# Patient Record
Sex: Female | Born: 1935 | Race: White | Hispanic: No | Smoking: Never smoker
Health system: Southern US, Community
[De-identification: ages and names within clinical notes are randomized; demographics above are authoritative.]

## PROBLEM LIST (undated history)

## (undated) DIAGNOSIS — C3430 Malignant neoplasm of lower lobe, unspecified bronchus or lung: Secondary | ICD-10-CM

## (undated) DIAGNOSIS — I10 Essential (primary) hypertension: Secondary | ICD-10-CM

## (undated) DIAGNOSIS — E039 Hypothyroidism, unspecified: Secondary | ICD-10-CM

## (undated) DIAGNOSIS — C801 Malignant (primary) neoplasm, unspecified: Secondary | ICD-10-CM

## (undated) HISTORY — DX: Malignant neoplasm of lower lobe, unspecified bronchus or lung: C34.30

## (undated) HISTORY — DX: Hypothyroidism, unspecified: E03.9

---

## 2003-08-12 ENCOUNTER — Ambulatory Visit (HOSPITAL_COMMUNITY): Admission: RE | Admit: 2003-08-12 | Discharge: 2003-08-12 | Payer: Self-pay | Admitting: Orthopedic Surgery

## 2003-08-16 ENCOUNTER — Inpatient Hospital Stay (HOSPITAL_COMMUNITY): Admission: RE | Admit: 2003-08-16 | Discharge: 2003-08-19 | Payer: Self-pay | Admitting: Orthopedic Surgery

## 2003-08-16 ENCOUNTER — Encounter (INDEPENDENT_AMBULATORY_CARE_PROVIDER_SITE_OTHER): Payer: Self-pay | Admitting: Specialist

## 2003-09-24 ENCOUNTER — Encounter (INDEPENDENT_AMBULATORY_CARE_PROVIDER_SITE_OTHER): Payer: Self-pay | Admitting: Specialist

## 2003-09-24 ENCOUNTER — Ambulatory Visit (HOSPITAL_COMMUNITY): Admission: RE | Admit: 2003-09-24 | Discharge: 2003-09-24 | Payer: Self-pay | Admitting: Internal Medicine

## 2003-10-18 ENCOUNTER — Ambulatory Visit (HOSPITAL_COMMUNITY): Admission: RE | Admit: 2003-10-18 | Discharge: 2003-10-18 | Payer: Self-pay | Admitting: Thoracic Surgery

## 2003-10-29 ENCOUNTER — Inpatient Hospital Stay (HOSPITAL_COMMUNITY): Admission: RE | Admit: 2003-10-29 | Discharge: 2003-11-03 | Payer: Self-pay | Admitting: Thoracic Surgery

## 2003-10-29 ENCOUNTER — Encounter (INDEPENDENT_AMBULATORY_CARE_PROVIDER_SITE_OTHER): Payer: Self-pay | Admitting: *Deleted

## 2003-11-10 ENCOUNTER — Encounter: Admission: RE | Admit: 2003-11-10 | Discharge: 2003-11-10 | Payer: Self-pay | Admitting: Thoracic Surgery

## 2003-12-08 ENCOUNTER — Encounter: Admission: RE | Admit: 2003-12-08 | Discharge: 2003-12-08 | Payer: Self-pay | Admitting: Thoracic Surgery

## 2003-12-10 ENCOUNTER — Ambulatory Visit: Admission: RE | Admit: 2003-12-10 | Discharge: 2004-01-17 | Payer: Self-pay | Admitting: Radiation Oncology

## 2004-02-02 ENCOUNTER — Encounter: Admission: RE | Admit: 2004-02-02 | Discharge: 2004-02-02 | Payer: Self-pay | Admitting: Thoracic Surgery

## 2004-02-18 ENCOUNTER — Ambulatory Visit: Admission: RE | Admit: 2004-02-18 | Discharge: 2004-04-12 | Payer: Self-pay | Admitting: Radiation Oncology

## 2004-02-22 ENCOUNTER — Ambulatory Visit: Admission: RE | Admit: 2004-02-22 | Discharge: 2004-02-22 | Payer: Self-pay | Admitting: Radiation Oncology

## 2004-04-12 ENCOUNTER — Encounter: Admission: RE | Admit: 2004-04-12 | Discharge: 2004-04-12 | Payer: Self-pay | Admitting: Thoracic Surgery

## 2004-05-11 ENCOUNTER — Ambulatory Visit: Admission: RE | Admit: 2004-05-11 | Discharge: 2004-05-11 | Payer: Self-pay | Admitting: Radiation Oncology

## 2004-07-03 ENCOUNTER — Ambulatory Visit (HOSPITAL_COMMUNITY): Admission: RE | Admit: 2004-07-03 | Discharge: 2004-07-03 | Payer: Self-pay | Admitting: Oncology

## 2004-07-10 ENCOUNTER — Ambulatory Visit: Payer: Self-pay | Admitting: Oncology

## 2004-07-11 ENCOUNTER — Encounter: Admission: RE | Admit: 2004-07-11 | Discharge: 2004-07-11 | Payer: Self-pay | Admitting: Thoracic Surgery

## 2004-10-18 ENCOUNTER — Encounter: Admission: RE | Admit: 2004-10-18 | Discharge: 2004-10-18 | Payer: Self-pay | Admitting: Thoracic Surgery

## 2004-12-30 IMAGING — NM NM BONE WHOLE BODY
3 series · 3 of 3 positions shown · non-contrast
Comparison: none

CLINICAL DATA: 78 year old with lung lesion.  Question metastatic disease. 
WHOLE BODY BONE SCAN 
25 mCi of Technetium 99m MDP was given intravenously.  Anterior and posterior images are performed of the whole body.  Bilateral renal activity and bladder activity are seen.  There is increased osseous remodeling in the region of the right acetabulum.  When compared with the prior head CT, the patient does have a right hip total arthroplasty.  There is photopenic defect secondary to the femoral prosthesis and mild increased osseous remodeling at the level of the greater trochanter and lesser trochanter.  Correlation is recommended with timing of surgery.  If surgery was recent, the activity in this region would be expected.  If this surgery was several months ago, the question of loosening or infection is raised.  
There are no focal areas of increased osseous remodeling to suggest osseous metastatic disease.  Degenerative changes are noted in the patella bilaterally.  
IMPRESSION 
No evidence for osseous metastatic disease. 
Changes about the right hip arthroplasty as described above.

[Series 1: tb total body · 0.62mm/px · 1 of 1 slices shown (1 of 3)]
[im 1/1]
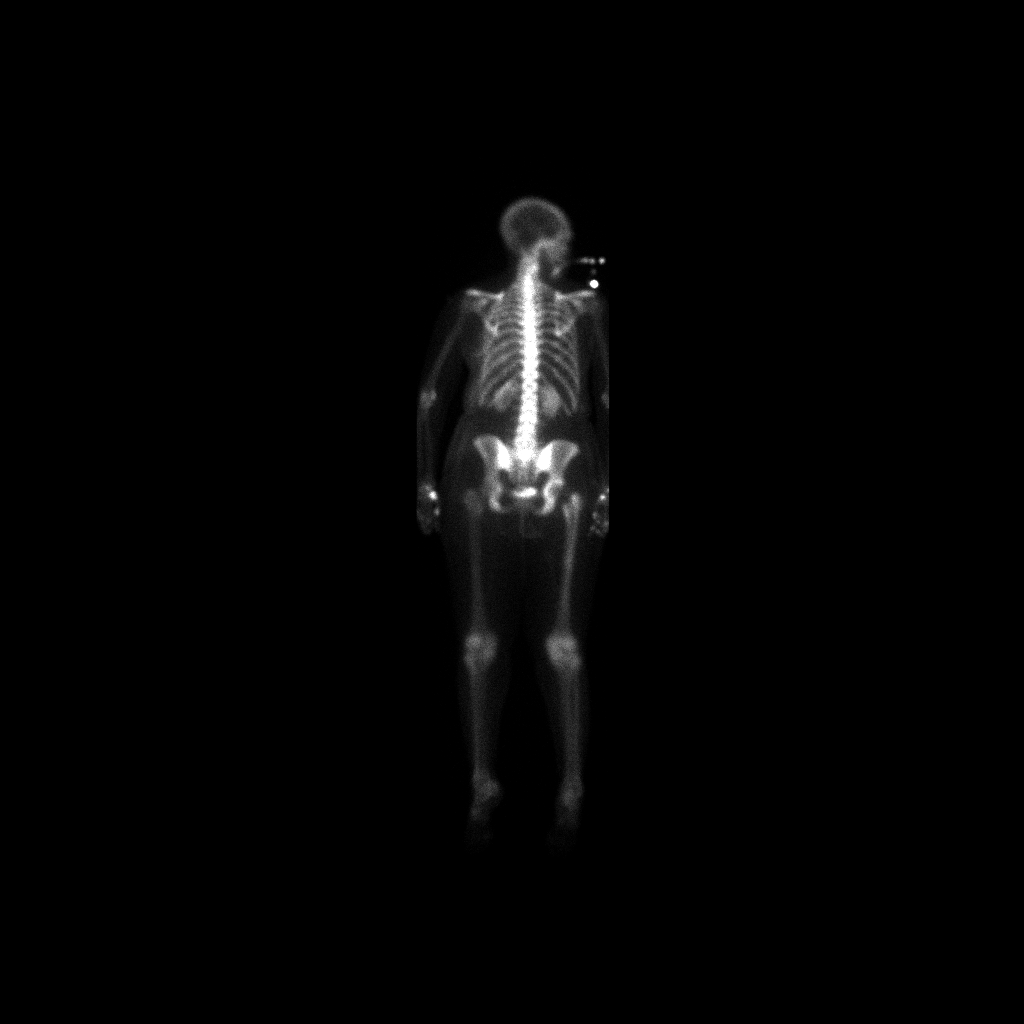

[Series 1: tb total body · 0.62mm/px · 1 of 1 slices shown (2 of 3)]
[im 1/1]
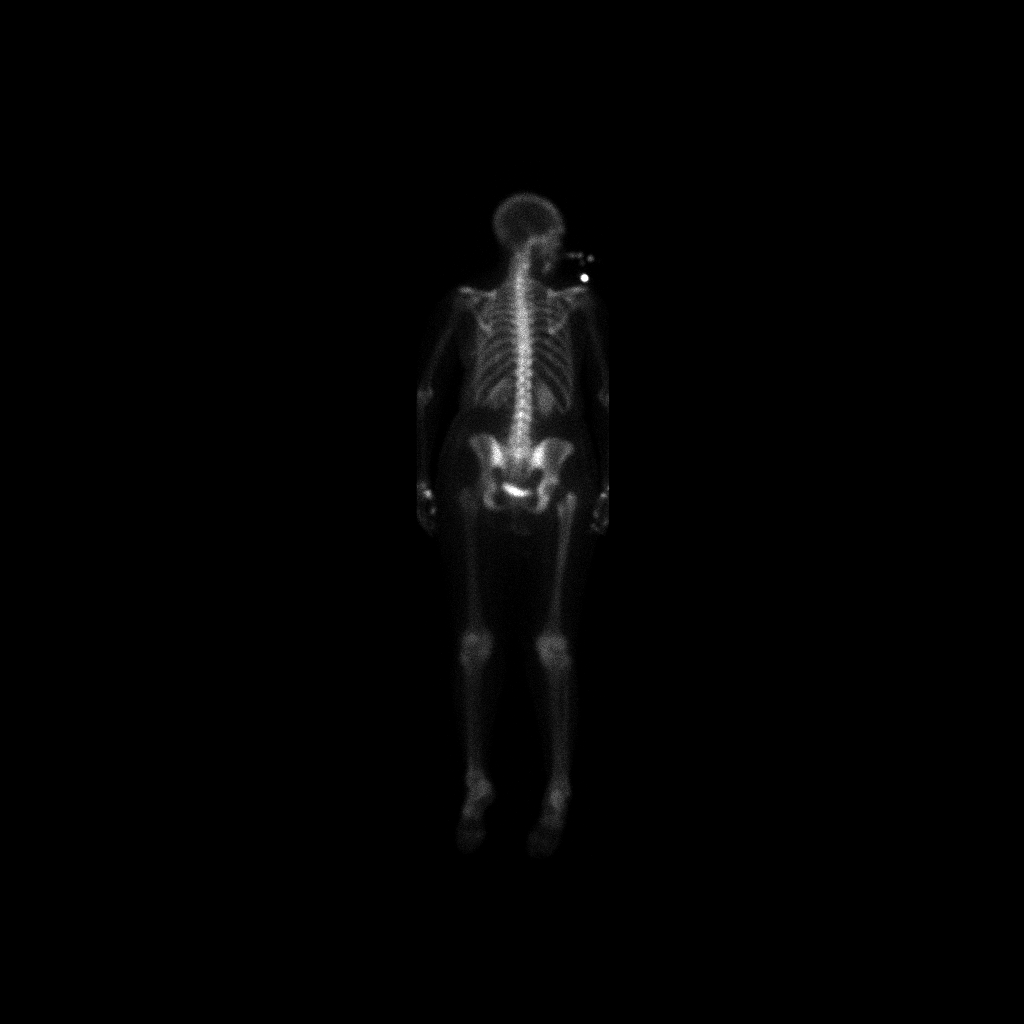

[Series 1: tb total body · 0.62mm/px · 1 of 1 slices shown (3 of 3)]
[im 1/1]
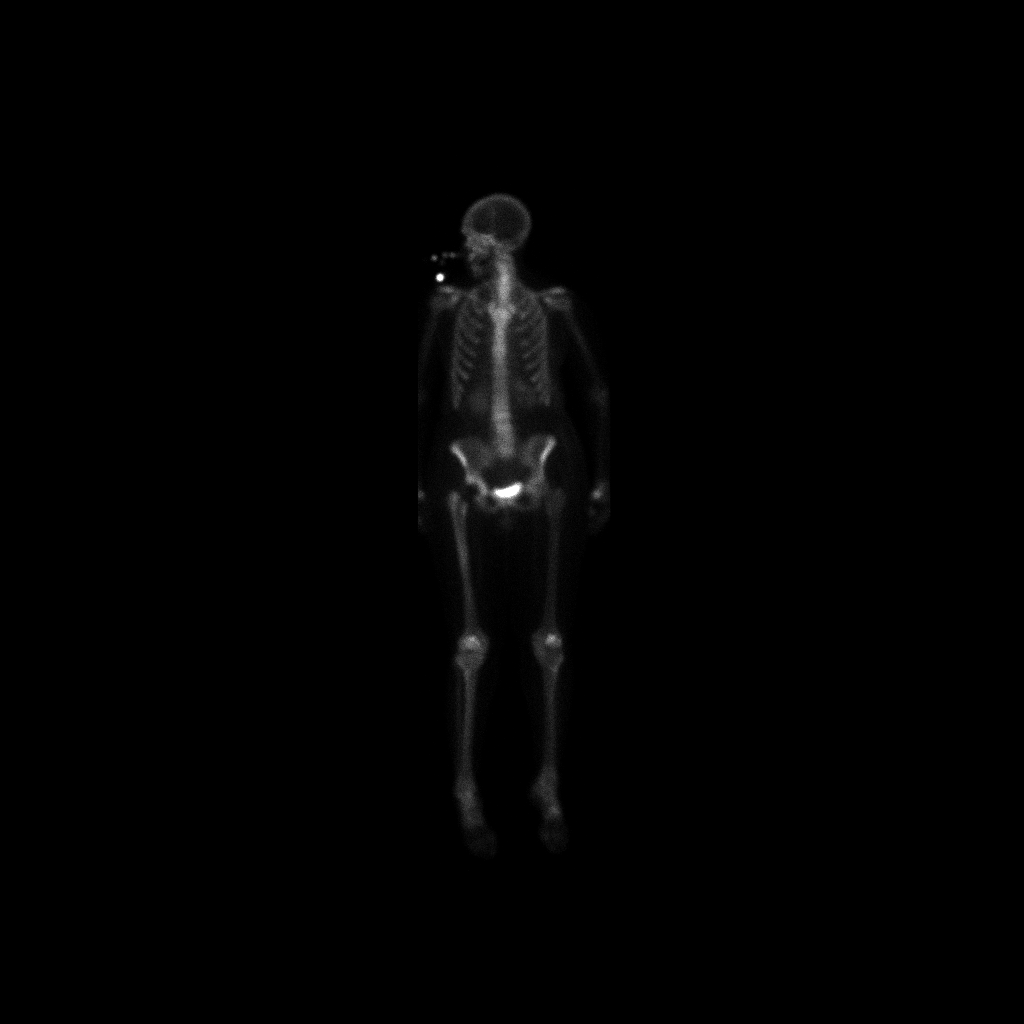

[3 of 3 positions shown; findings below may reference images not displayed]

## 2005-01-01 ENCOUNTER — Ambulatory Visit: Payer: Self-pay | Admitting: Oncology

## 2005-01-03 ENCOUNTER — Ambulatory Visit (HOSPITAL_COMMUNITY): Admission: RE | Admit: 2005-01-03 | Discharge: 2005-01-03 | Payer: Self-pay | Admitting: Oncology

## 2005-03-09 ENCOUNTER — Ambulatory Visit: Payer: Self-pay | Admitting: Oncology

## 2005-03-12 ENCOUNTER — Ambulatory Visit (HOSPITAL_COMMUNITY): Admission: RE | Admit: 2005-03-12 | Discharge: 2005-03-12 | Payer: Self-pay | Admitting: Oncology

## 2005-05-17 ENCOUNTER — Ambulatory Visit: Payer: Self-pay | Admitting: Oncology

## 2005-05-17 ENCOUNTER — Ambulatory Visit (HOSPITAL_COMMUNITY): Admission: RE | Admit: 2005-05-17 | Discharge: 2005-05-17 | Payer: Self-pay | Admitting: Oncology

## 2005-10-12 ENCOUNTER — Ambulatory Visit: Payer: Self-pay | Admitting: Oncology

## 2005-10-15 ENCOUNTER — Ambulatory Visit (HOSPITAL_COMMUNITY): Admission: RE | Admit: 2005-10-15 | Discharge: 2005-10-15 | Payer: Self-pay | Admitting: Oncology

## 2006-01-03 ENCOUNTER — Ambulatory Visit: Payer: Self-pay | Admitting: Oncology

## 2006-01-08 ENCOUNTER — Ambulatory Visit: Admission: RE | Admit: 2006-01-08 | Discharge: 2006-01-08 | Payer: Self-pay | Admitting: Oncology

## 2006-01-09 LAB — COMPREHENSIVE METABOLIC PANEL
AST: 25 U/L (ref 0–37)
Albumin: 4.3 g/dL (ref 3.5–5.2)
Alkaline Phosphatase: 134 U/L — ABNORMAL HIGH (ref 39–117)
BUN: 31 mg/dL — ABNORMAL HIGH (ref 6–23)
Calcium: 9.5 mg/dL (ref 8.4–10.5)
Creatinine, Ser: 1.4 mg/dL — ABNORMAL HIGH (ref 0.4–1.2)
Glucose, Bld: 87 mg/dL (ref 70–99)
Potassium: 5.1 mEq/L (ref 3.5–5.3)

## 2006-01-11 LAB — ANTI-NUCLEAR AB-TITER (ANA TITER)

## 2006-04-12 ENCOUNTER — Ambulatory Visit: Payer: Self-pay | Admitting: Oncology

## 2006-04-17 ENCOUNTER — Ambulatory Visit (HOSPITAL_COMMUNITY): Admission: RE | Admit: 2006-04-17 | Discharge: 2006-04-17 | Payer: Self-pay | Admitting: Oncology

## 2006-04-17 LAB — COMPREHENSIVE METABOLIC PANEL
ALT: 17 U/L (ref 0–40)
Albumin: 3.9 g/dL (ref 3.5–5.2)
Alkaline Phosphatase: 127 U/L — ABNORMAL HIGH (ref 39–117)
CO2: 29 mEq/L (ref 19–32)
Glucose, Bld: 101 mg/dL — ABNORMAL HIGH (ref 70–99)
Potassium: 4.8 mEq/L (ref 3.5–5.3)
Sodium: 140 mEq/L (ref 135–145)
Total Protein: 6.6 g/dL (ref 6.0–8.3)

## 2006-04-17 LAB — CBC WITH DIFFERENTIAL/PLATELET
BASO%: 0.3 % (ref 0.0–2.0)
Eosinophils Absolute: 0.1 10*3/uL (ref 0.0–0.5)
MONO#: 0.3 10*3/uL (ref 0.1–0.9)
MONO%: 11.5 % (ref 0.0–13.0)
NEUT#: 2 10*3/uL (ref 1.5–6.5)
RBC: 3.28 10*6/uL — ABNORMAL LOW (ref 3.70–5.32)
RDW: 14.3 % (ref 11.3–14.5)
WBC: 3 10*3/uL — ABNORMAL LOW (ref 3.9–10.0)

## 2006-08-15 ENCOUNTER — Ambulatory Visit: Payer: Self-pay | Admitting: Oncology

## 2006-08-20 ENCOUNTER — Ambulatory Visit (HOSPITAL_COMMUNITY): Admission: RE | Admit: 2006-08-20 | Discharge: 2006-08-20 | Payer: Self-pay | Admitting: Oncology

## 2006-08-20 LAB — COMPREHENSIVE METABOLIC PANEL
ALT: 19 U/L (ref 0–35)
Albumin: 4 g/dL (ref 3.5–5.2)
CO2: 29 mEq/L (ref 19–32)
Calcium: 9.8 mg/dL (ref 8.4–10.5)
Chloride: 93 mEq/L — ABNORMAL LOW (ref 96–112)
Glucose, Bld: 117 mg/dL — ABNORMAL HIGH (ref 70–99)
Potassium: 4.3 mEq/L (ref 3.5–5.3)
Sodium: 130 mEq/L — ABNORMAL LOW (ref 135–145)
Total Protein: 7.1 g/dL (ref 6.0–8.3)

## 2006-08-20 LAB — CBC WITH DIFFERENTIAL/PLATELET
BASO%: 0.5 % (ref 0.0–2.0)
Eosinophils Absolute: 0 10*3/uL (ref 0.0–0.5)
MCHC: 33.8 g/dL (ref 32.0–36.0)
MONO#: 0.3 10*3/uL (ref 0.1–0.9)
NEUT#: 3.9 10*3/uL (ref 1.5–6.5)
RBC: 3.84 10*6/uL (ref 3.70–5.32)
WBC: 4.7 10*3/uL (ref 3.9–10.0)
lymph#: 0.5 10*3/uL — ABNORMAL LOW (ref 0.9–3.3)

## 2006-08-20 LAB — TSH: TSH: 14.722 u[IU]/mL — ABNORMAL HIGH (ref 0.350–5.500)

## 2006-11-21 ENCOUNTER — Ambulatory Visit: Payer: Self-pay | Admitting: Oncology

## 2006-11-26 ENCOUNTER — Ambulatory Visit (HOSPITAL_COMMUNITY): Admission: RE | Admit: 2006-11-26 | Discharge: 2006-11-26 | Payer: Self-pay | Admitting: Oncology

## 2006-11-26 LAB — CBC WITH DIFFERENTIAL/PLATELET
BASO%: 0.2 % (ref 0.0–2.0)
Eosinophils Absolute: 0.1 10*3/uL (ref 0.0–0.5)
HCT: 30.6 % — ABNORMAL LOW (ref 34.8–46.6)
MCHC: 34.7 g/dL (ref 32.0–36.0)
MONO#: 0.3 10*3/uL (ref 0.1–0.9)
NEUT#: 1.3 10*3/uL — ABNORMAL LOW (ref 1.5–6.5)
RBC: 3.25 10*6/uL — ABNORMAL LOW (ref 3.70–5.32)
WBC: 2.2 10*3/uL — ABNORMAL LOW (ref 3.9–10.0)
lymph#: 0.6 10*3/uL — ABNORMAL LOW (ref 0.9–3.3)

## 2006-11-26 LAB — COMPREHENSIVE METABOLIC PANEL
ALT: 27 U/L (ref 0–35)
Albumin: 3.7 g/dL (ref 3.5–5.2)
CO2: 29 mEq/L (ref 19–32)
Calcium: 9.8 mg/dL (ref 8.4–10.5)
Chloride: 105 mEq/L (ref 96–112)
Sodium: 142 mEq/L (ref 135–145)
Total Protein: 6.6 g/dL (ref 6.0–8.3)

## 2007-02-21 ENCOUNTER — Ambulatory Visit: Payer: Self-pay | Admitting: Oncology

## 2007-02-25 ENCOUNTER — Ambulatory Visit (HOSPITAL_COMMUNITY): Admission: RE | Admit: 2007-02-25 | Discharge: 2007-02-25 | Payer: Self-pay | Admitting: Oncology

## 2007-02-25 LAB — COMPREHENSIVE METABOLIC PANEL
ALT: 19 U/L (ref 0–35)
CO2: 29 mEq/L (ref 19–32)
Chloride: 102 mEq/L (ref 96–112)
Sodium: 139 mEq/L (ref 135–145)
Total Bilirubin: 0.9 mg/dL (ref 0.3–1.2)
Total Protein: 7 g/dL (ref 6.0–8.3)

## 2007-02-25 LAB — CBC WITH DIFFERENTIAL/PLATELET
BASO%: 0.3 % (ref 0.0–2.0)
LYMPH%: 19.4 % (ref 14.0–48.0)
MCHC: 34.8 g/dL (ref 32.0–36.0)
MONO#: 0.2 10*3/uL (ref 0.1–0.9)
RBC: 3.61 10*6/uL — ABNORMAL LOW (ref 3.70–5.32)
RDW: 15.5 % — ABNORMAL HIGH (ref 11.3–14.5)
WBC: 2.9 10*3/uL — ABNORMAL LOW (ref 3.9–10.0)
lymph#: 0.6 10*3/uL — ABNORMAL LOW (ref 0.9–3.3)

## 2007-02-25 LAB — TSH: TSH: 17.002 u[IU]/mL — ABNORMAL HIGH (ref 0.350–5.500)

## 2007-06-02 ENCOUNTER — Ambulatory Visit: Payer: Self-pay | Admitting: Oncology

## 2007-06-04 ENCOUNTER — Ambulatory Visit (HOSPITAL_COMMUNITY): Admission: RE | Admit: 2007-06-04 | Discharge: 2007-06-04 | Payer: Self-pay | Admitting: Oncology

## 2007-06-04 LAB — CBC WITH DIFFERENTIAL/PLATELET
BASO%: 0 % (ref 0.0–2.0)
EOS%: 1.1 % (ref 0.0–7.0)
HCT: 36.6 % (ref 34.8–46.6)
LYMPH%: 9.1 % — ABNORMAL LOW (ref 14.0–48.0)
MCH: 34.4 pg — ABNORMAL HIGH (ref 26.0–34.0)
MCHC: 34.9 g/dL (ref 32.0–36.0)
MCV: 98.6 fL (ref 81.0–101.0)
MONO%: 9.6 % (ref 0.0–13.0)
NEUT%: 80.2 % — ABNORMAL HIGH (ref 39.6–76.8)
Platelets: 163 10*3/uL (ref 145–400)
RBC: 3.72 10*6/uL (ref 3.70–5.32)
WBC: 3.9 10*3/uL (ref 3.9–10.0)

## 2007-06-04 LAB — TECHNOLOGIST REVIEW

## 2007-06-05 LAB — COMPREHENSIVE METABOLIC PANEL
ALT: 39 U/L — ABNORMAL HIGH (ref 0–35)
AST: 24 U/L (ref 0–37)
BUN: 14 mg/dL (ref 6–23)
Calcium: 10.3 mg/dL (ref 8.4–10.5)
Chloride: 101 mEq/L (ref 96–112)
Creatinine, Ser: 1.47 mg/dL — ABNORMAL HIGH (ref 0.40–1.20)
Total Bilirubin: 0.6 mg/dL (ref 0.3–1.2)

## 2007-06-05 LAB — PROTHROMBIN TIME: INR: 0.9 (ref 0.0–1.5)

## 2007-08-28 ENCOUNTER — Ambulatory Visit (HOSPITAL_COMMUNITY): Admission: RE | Admit: 2007-08-28 | Discharge: 2007-08-28 | Payer: Self-pay | Admitting: Oncology

## 2007-09-02 ENCOUNTER — Ambulatory Visit: Payer: Self-pay | Admitting: Oncology

## 2007-09-04 LAB — CBC WITH DIFFERENTIAL/PLATELET
BASO%: 0.3 % (ref 0.0–2.0)
Eosinophils Absolute: 0.2 10*3/uL (ref 0.0–0.5)
HCT: 35.4 % (ref 34.8–46.6)
MCHC: 34 g/dL (ref 32.0–36.0)
MONO#: 0.5 10*3/uL (ref 0.1–0.9)
NEUT#: 2.4 10*3/uL (ref 1.5–6.5)
NEUT%: 68.8 % (ref 39.6–76.8)
RBC: 3.66 10*6/uL — ABNORMAL LOW (ref 3.70–5.32)
WBC: 3.5 10*3/uL — ABNORMAL LOW (ref 3.9–10.0)
lymph#: 0.5 10*3/uL — ABNORMAL LOW (ref 0.9–3.3)

## 2007-09-04 LAB — COMPREHENSIVE METABOLIC PANEL
AST: 24 U/L (ref 0–37)
Alkaline Phosphatase: 124 U/L — ABNORMAL HIGH (ref 39–117)
BUN: 16 mg/dL (ref 6–23)
Calcium: 9.6 mg/dL (ref 8.4–10.5)
Creatinine, Ser: 1.41 mg/dL — ABNORMAL HIGH (ref 0.40–1.20)
Total Bilirubin: 0.6 mg/dL (ref 0.3–1.2)

## 2007-11-20 ENCOUNTER — Ambulatory Visit: Payer: Self-pay | Admitting: Oncology

## 2007-11-25 ENCOUNTER — Ambulatory Visit (HOSPITAL_COMMUNITY): Admission: RE | Admit: 2007-11-25 | Discharge: 2007-11-25 | Payer: Self-pay | Admitting: Oncology

## 2007-11-25 LAB — CBC WITH DIFFERENTIAL/PLATELET
BASO%: 0.2 % (ref 0.0–2.0)
EOS%: 4.2 % (ref 0.0–7.0)
HGB: 11.3 g/dL — ABNORMAL LOW (ref 11.6–15.9)
MCH: 32.1 pg (ref 26.0–34.0)
MCHC: 34.2 g/dL (ref 32.0–36.0)
RBC: 3.51 10*6/uL — ABNORMAL LOW (ref 3.70–5.32)
RDW: 15.1 % — ABNORMAL HIGH (ref 11.3–14.5)
lymph#: 0.4 10*3/uL — ABNORMAL LOW (ref 0.9–3.3)

## 2007-11-25 LAB — COMPREHENSIVE METABOLIC PANEL
ALT: 20 U/L (ref 0–35)
AST: 28 U/L (ref 0–37)
Albumin: 3.6 g/dL (ref 3.5–5.2)
BUN: 19 mg/dL (ref 6–23)
Calcium: 9.5 mg/dL (ref 8.4–10.5)
Chloride: 105 mEq/L (ref 96–112)
Potassium: 4 mEq/L (ref 3.5–5.3)

## 2007-11-25 LAB — CEA: CEA: <0.5 ng/mL (ref 0.0–5.0)

## 2008-02-23 ENCOUNTER — Ambulatory Visit: Payer: Self-pay | Admitting: Oncology

## 2008-02-25 ENCOUNTER — Ambulatory Visit (HOSPITAL_COMMUNITY): Admission: RE | Admit: 2008-02-25 | Discharge: 2008-02-25 | Payer: Self-pay | Admitting: Oncology

## 2008-02-25 LAB — COMPREHENSIVE METABOLIC PANEL
AST: 24 U/L (ref 0–37)
Albumin: 3.7 g/dL (ref 3.5–5.2)
Alkaline Phosphatase: 120 U/L — ABNORMAL HIGH (ref 39–117)
BUN: 20 mg/dL (ref 6–23)
Calcium: 9.8 mg/dL (ref 8.4–10.5)
Chloride: 103 mEq/L (ref 96–112)
Potassium: 4 mEq/L (ref 3.5–5.3)
Sodium: 139 mEq/L (ref 135–145)
Total Protein: 6.8 g/dL (ref 6.0–8.3)

## 2008-02-25 LAB — CBC WITH DIFFERENTIAL/PLATELET
Basophils Absolute: 0 10*3/uL (ref 0.0–0.1)
EOS%: 4.2 % (ref 0.0–7.0)
Eosinophils Absolute: 0.1 10*3/uL (ref 0.0–0.5)
HGB: 11.6 g/dL (ref 11.6–15.9)
MCH: 31.9 pg (ref 26.0–34.0)
NEUT#: 2.3 10*3/uL (ref 1.5–6.5)
RBC: 3.64 10*6/uL — ABNORMAL LOW (ref 3.70–5.32)
RDW: 15.3 % — ABNORMAL HIGH (ref 11.3–14.5)
lymph#: 0.5 10*3/uL — ABNORMAL LOW (ref 0.9–3.3)

## 2008-06-08 ENCOUNTER — Ambulatory Visit: Payer: Self-pay | Admitting: Oncology

## 2008-06-10 ENCOUNTER — Ambulatory Visit (HOSPITAL_COMMUNITY): Admission: RE | Admit: 2008-06-10 | Discharge: 2008-06-10 | Payer: Self-pay | Admitting: Oncology

## 2008-06-10 LAB — COMPREHENSIVE METABOLIC PANEL
Albumin: 3.8 g/dL (ref 3.5–5.2)
BUN: 17 mg/dL (ref 6–23)
Calcium: 9.8 mg/dL (ref 8.4–10.5)
Chloride: 102 mEq/L (ref 96–112)
Glucose, Bld: 98 mg/dL (ref 70–99)
Potassium: 4.3 mEq/L (ref 3.5–5.3)
Total Protein: 6.7 g/dL (ref 6.0–8.3)

## 2008-06-10 LAB — CBC WITH DIFFERENTIAL/PLATELET
Basophils Absolute: 0 10*3/uL (ref 0.0–0.1)
Eosinophils Absolute: 0.2 10*3/uL (ref 0.0–0.5)
HGB: 11.8 g/dL (ref 11.6–15.9)
MONO#: 0.3 10*3/uL (ref 0.1–0.9)
NEUT#: 1.9 10*3/uL (ref 1.5–6.5)
RDW: 16.2 % — ABNORMAL HIGH (ref 11.3–14.5)
WBC: 2.8 10*3/uL — ABNORMAL LOW (ref 3.9–10.0)
lymph#: 0.4 10*3/uL — ABNORMAL LOW (ref 0.9–3.3)

## 2008-10-19 ENCOUNTER — Ambulatory Visit: Payer: Self-pay | Admitting: Oncology

## 2008-10-21 ENCOUNTER — Ambulatory Visit (HOSPITAL_COMMUNITY): Admission: RE | Admit: 2008-10-21 | Discharge: 2008-10-21 | Payer: Self-pay | Admitting: Oncology

## 2008-10-21 LAB — CBC WITH DIFFERENTIAL/PLATELET
Basophils Absolute: 0 10*3/uL (ref 0.0–0.1)
Eosinophils Absolute: 0.1 10*3/uL (ref 0.0–0.5)
HGB: 11.6 g/dL (ref 11.6–15.9)
MCV: 93.1 fL (ref 79.5–101.0)
MONO%: 10.9 % (ref 0.0–14.0)
NEUT#: 1.5 10*3/uL (ref 1.5–6.5)
Platelets: 171 10*3/uL (ref 145–400)
RDW: 15.5 % — ABNORMAL HIGH (ref 11.2–14.5)

## 2008-10-21 LAB — COMPREHENSIVE METABOLIC PANEL
Albumin: 3.8 g/dL (ref 3.5–5.2)
Alkaline Phosphatase: 144 U/L — ABNORMAL HIGH (ref 39–117)
BUN: 18 mg/dL (ref 6–23)
Calcium: 9.5 mg/dL (ref 8.4–10.5)
Chloride: 101 mEq/L (ref 96–112)
Glucose, Bld: 103 mg/dL — ABNORMAL HIGH (ref 70–99)
Potassium: 4.1 mEq/L (ref 3.5–5.3)

## 2009-02-18 ENCOUNTER — Ambulatory Visit: Payer: Self-pay | Admitting: Oncology

## 2009-02-22 ENCOUNTER — Ambulatory Visit (HOSPITAL_COMMUNITY): Admission: RE | Admit: 2009-02-22 | Discharge: 2009-02-22 | Payer: Self-pay | Admitting: Oncology

## 2009-02-22 LAB — CBC WITH DIFFERENTIAL/PLATELET
Basophils Absolute: 0 10*3/uL (ref 0.0–0.1)
Eosinophils Absolute: 0.2 10*3/uL (ref 0.0–0.5)
HGB: 12.2 g/dL (ref 11.6–15.9)
LYMPH%: 11.9 % — ABNORMAL LOW (ref 14.0–49.7)
MCV: 93.2 fL (ref 79.5–101.0)
MONO%: 8.4 % (ref 0.0–14.0)
NEUT#: 3.6 10*3/uL (ref 1.5–6.5)
NEUT%: 76 % (ref 38.4–76.8)
Platelets: 149 10*3/uL (ref 145–400)

## 2009-02-22 LAB — COMPREHENSIVE METABOLIC PANEL
Albumin: 3.5 g/dL (ref 3.5–5.2)
Alkaline Phosphatase: 82 U/L (ref 39–117)
BUN: 20 mg/dL (ref 6–23)
Creatinine, Ser: 1.41 mg/dL — ABNORMAL HIGH (ref 0.40–1.20)
Glucose, Bld: 96 mg/dL (ref 70–99)
Total Bilirubin: 0.8 mg/dL (ref 0.3–1.2)

## 2009-08-22 ENCOUNTER — Ambulatory Visit: Payer: Self-pay | Admitting: Oncology

## 2009-08-24 ENCOUNTER — Ambulatory Visit (HOSPITAL_COMMUNITY): Admission: RE | Admit: 2009-08-24 | Discharge: 2009-08-24 | Payer: Self-pay | Admitting: Oncology

## 2009-08-24 LAB — CBC WITH DIFFERENTIAL/PLATELET
BASO%: 0.3 % (ref 0.0–2.0)
Basophils Absolute: 0 10*3/uL (ref 0.0–0.1)
HCT: 37.3 % (ref 34.8–46.6)
LYMPH%: 12.7 % — ABNORMAL LOW (ref 14.0–49.7)
MCH: 31.5 pg (ref 25.1–34.0)
MCHC: 32.7 g/dL (ref 31.5–36.0)
MONO#: 0.3 10*3/uL (ref 0.1–0.9)
NEUT%: 76.3 % (ref 38.4–76.8)
Platelets: 145 10*3/uL (ref 145–400)
WBC: 3.5 10*3/uL — ABNORMAL LOW (ref 3.9–10.3)

## 2009-08-24 LAB — COMPREHENSIVE METABOLIC PANEL
AST: 28 U/L (ref 0–37)
BUN: 11 mg/dL (ref 6–23)
Calcium: 9.7 mg/dL (ref 8.4–10.5)
Chloride: 100 mEq/L (ref 96–112)
Creatinine, Ser: 1.28 mg/dL — ABNORMAL HIGH (ref 0.40–1.20)
Glucose, Bld: 94 mg/dL (ref 70–99)

## 2010-02-20 ENCOUNTER — Ambulatory Visit: Payer: Self-pay | Admitting: Oncology

## 2010-02-22 ENCOUNTER — Ambulatory Visit (HOSPITAL_COMMUNITY): Admission: RE | Admit: 2010-02-22 | Discharge: 2010-02-22 | Payer: Self-pay | Admitting: Oncology

## 2010-02-22 LAB — COMPREHENSIVE METABOLIC PANEL
AST: 26 U/L (ref 0–37)
Albumin: 3.9 g/dL (ref 3.5–5.2)
Alkaline Phosphatase: 141 U/L — ABNORMAL HIGH (ref 39–117)
BUN: 16 mg/dL (ref 6–23)
Calcium: 9.7 mg/dL (ref 8.4–10.5)
Chloride: 104 mEq/L (ref 96–112)
Potassium: 4.1 mEq/L (ref 3.5–5.3)
Sodium: 138 mEq/L (ref 135–145)
Total Protein: 7.2 g/dL (ref 6.0–8.3)

## 2010-02-22 LAB — CBC WITH DIFFERENTIAL/PLATELET
BASO%: 0.6 % (ref 0.0–2.0)
Basophils Absolute: 0 10*3/uL (ref 0.0–0.1)
EOS%: 4 % (ref 0.0–7.0)
HCT: 34.1 % — ABNORMAL LOW (ref 34.8–46.6)
HGB: 11.2 g/dL — ABNORMAL LOW (ref 11.6–15.9)
MCH: 29.9 pg (ref 25.1–34.0)
MCHC: 32.8 g/dL (ref 31.5–36.0)
MONO#: 0.3 10*3/uL (ref 0.1–0.9)
NEUT%: 71.7 % (ref 38.4–76.8)
RDW: 15.5 % — ABNORMAL HIGH (ref 11.2–14.5)
WBC: 3.5 10*3/uL — ABNORMAL LOW (ref 3.9–10.3)
lymph#: 0.6 10*3/uL — ABNORMAL LOW (ref 0.9–3.3)

## 2010-05-12 ENCOUNTER — Ambulatory Visit: Payer: Self-pay | Admitting: Oncology

## 2010-05-16 ENCOUNTER — Ambulatory Visit (HOSPITAL_COMMUNITY)
Admission: RE | Admit: 2010-05-16 | Discharge: 2010-05-16 | Payer: Self-pay | Source: Home / Self Care | Admitting: Oncology

## 2010-05-16 LAB — COMPREHENSIVE METABOLIC PANEL
Albumin: 4.3 g/dL (ref 3.5–5.2)
BUN: 15 mg/dL (ref 6–23)
CO2: 26 mEq/L (ref 19–32)
Calcium: 9.5 mg/dL (ref 8.4–10.5)
Chloride: 103 mEq/L (ref 96–112)
Creatinine, Ser: 1.25 mg/dL — ABNORMAL HIGH (ref 0.40–1.20)
Potassium: 4.3 mEq/L (ref 3.5–5.3)

## 2010-05-16 LAB — CBC WITH DIFFERENTIAL/PLATELET
Basophils Absolute: 0 10*3/uL (ref 0.0–0.1)
Eosinophils Absolute: 0 10*3/uL (ref 0.0–0.5)
HCT: 34.4 % — ABNORMAL LOW (ref 34.8–46.6)
HGB: 11.4 g/dL — ABNORMAL LOW (ref 11.6–15.9)
MCH: 30.4 pg (ref 25.1–34.0)
MONO#: 0.2 10*3/uL (ref 0.1–0.9)
NEUT#: 2.7 10*3/uL (ref 1.5–6.5)
NEUT%: 76.9 % — ABNORMAL HIGH (ref 38.4–76.8)
RDW: 16.3 % — ABNORMAL HIGH (ref 11.2–14.5)
WBC: 3.5 10*3/uL — ABNORMAL LOW (ref 3.9–10.3)
lymph#: 0.5 10*3/uL — ABNORMAL LOW (ref 0.9–3.3)

## 2010-08-10 ENCOUNTER — Ambulatory Visit: Payer: Self-pay | Admitting: Oncology

## 2010-08-16 ENCOUNTER — Ambulatory Visit (HOSPITAL_COMMUNITY)
Admission: RE | Admit: 2010-08-16 | Discharge: 2010-08-16 | Payer: Self-pay | Source: Home / Self Care | Attending: Oncology | Admitting: Oncology

## 2010-08-16 ENCOUNTER — Ambulatory Visit: Payer: Self-pay | Admitting: Oncology

## 2010-08-16 LAB — CMP (CANCER CENTER ONLY)
ALT(SGPT): 20 U/L (ref 10–47)
AST: 27 U/L (ref 11–38)
Albumin: 3.6 g/dL (ref 3.3–5.5)
Alkaline Phosphatase: 156 U/L — ABNORMAL HIGH (ref 26–84)
BUN, Bld: 19 mg/dL (ref 7–22)
CO2: 30 mEq/L (ref 18–33)
Calcium: 9.6 mg/dL (ref 8.0–10.3)
Chloride: 101 mEq/L (ref 98–108)
Creat: 1.3 mg/dl — ABNORMAL HIGH (ref 0.6–1.2)
Glucose, Bld: 110 mg/dL (ref 73–118)
Potassium: 4.1 mEq/L (ref 3.3–4.7)
Sodium: 137 mEq/L (ref 128–145)
Total Bilirubin: 0.6 mg/dl (ref 0.20–1.60)
Total Protein: 7.4 g/dL (ref 6.4–8.1)

## 2010-08-16 LAB — CBC WITH DIFFERENTIAL/PLATELET
BASO%: 0 % (ref 0.0–2.0)
Basophils Absolute: 0 10*3/uL (ref 0.0–0.1)
EOS%: 3.3 % (ref 0.0–7.0)
Eosinophils Absolute: 0.1 10*3/uL (ref 0.0–0.5)
HCT: 35.3 % (ref 34.8–46.6)
HGB: 11.6 g/dL (ref 11.6–15.9)
LYMPH%: 21.7 % (ref 14.0–49.7)
MCH: 29.7 pg (ref 25.1–34.0)
MCHC: 32.9 g/dL (ref 31.5–36.0)
MCV: 90.3 fL (ref 79.5–101.0)
MONO#: 0.2 10*3/uL (ref 0.1–0.9)
MONO%: 7.7 % (ref 0.0–14.0)
NEUT#: 2 10*3/uL (ref 1.5–6.5)
NEUT%: 67.3 % (ref 38.4–76.8)
Platelets: 167 10*3/uL (ref 145–400)
RBC: 3.91 10*6/uL (ref 3.70–5.45)
RDW: 14.5 % (ref 11.2–14.5)
WBC: 3 10*3/uL — ABNORMAL LOW (ref 3.9–10.3)
lymph#: 0.7 10*3/uL — ABNORMAL LOW (ref 0.9–3.3)

## 2010-09-02 ENCOUNTER — Other Ambulatory Visit: Payer: Self-pay | Admitting: Oncology

## 2010-09-02 DIAGNOSIS — C3490 Malignant neoplasm of unspecified part of unspecified bronchus or lung: Secondary | ICD-10-CM

## 2010-09-03 ENCOUNTER — Encounter: Payer: Self-pay | Admitting: Thoracic Surgery

## 2010-11-15 ENCOUNTER — Other Ambulatory Visit: Payer: Self-pay | Admitting: Oncology

## 2010-11-15 ENCOUNTER — Encounter (HOSPITAL_BASED_OUTPATIENT_CLINIC_OR_DEPARTMENT_OTHER): Payer: Medicare Other | Admitting: Oncology

## 2010-11-15 ENCOUNTER — Ambulatory Visit (HOSPITAL_COMMUNITY)
Admission: RE | Admit: 2010-11-15 | Discharge: 2010-11-15 | Disposition: A | Discharge status: Home / Self Care | Payer: Medicare Other | Source: Ambulatory Visit | Attending: Oncology | Admitting: Oncology

## 2010-11-15 DIAGNOSIS — C3490 Malignant neoplasm of unspecified part of unspecified bronchus or lung: Secondary | ICD-10-CM | POA: Insufficient documentation

## 2010-11-15 DIAGNOSIS — E039 Hypothyroidism, unspecified: Secondary | ICD-10-CM

## 2010-11-15 DIAGNOSIS — C3430 Malignant neoplasm of lower lobe, unspecified bronchus or lung: Secondary | ICD-10-CM

## 2010-11-15 DIAGNOSIS — M169 Osteoarthritis of hip, unspecified: Secondary | ICD-10-CM

## 2010-11-15 LAB — CBC WITH DIFFERENTIAL/PLATELET
BASO%: 0.5 % (ref 0.0–2.0)
EOS%: 5.2 % (ref 0.0–7.0)
MCH: 29.7 pg (ref 25.1–34.0)
MCHC: 33 g/dL (ref 31.5–36.0)
MCV: 89.8 fL (ref 79.5–101.0)
MONO%: 8.2 % (ref 0.0–14.0)
RBC: 3.81 10*6/uL (ref 3.70–5.45)
RDW: 15.5 % — ABNORMAL HIGH (ref 11.2–14.5)
lymph#: 0.8 10*3/uL — ABNORMAL LOW (ref 0.9–3.3)
nRBC: 0 % (ref 0–0)

## 2010-11-15 LAB — COMPREHENSIVE METABOLIC PANEL
AST: 24 U/L (ref 0–37)
Alkaline Phosphatase: 157 U/L — ABNORMAL HIGH (ref 39–117)
Glucose, Bld: 101 mg/dL — ABNORMAL HIGH (ref 70–99)
Potassium: 4.7 mEq/L (ref 3.5–5.3)
Sodium: 137 mEq/L (ref 135–145)
Total Bilirubin: 0.4 mg/dL (ref 0.3–1.2)
Total Protein: 6.7 g/dL (ref 6.0–8.3)

## 2010-12-29 NOTE — H&P (Signed)
NAME:  [name], [name] NO.:  [id number]   MEDICAL RECORD NO.:  [id number]                   PATIENT TYPE:  INP   LOCATION:  3314                                 FACILITY:  [facility]   PHYSICIAN:  Ines Bloomer, M.D.              DATE OF BIRTH:  [birth year]   DATE OF ADMISSION:  10/29/2003  DATE OF DISCHARGE:                                HISTORY & PHYSICAL   CHIEF COMPLAINT:  Left lower lobe lung nodule.   HISTORY OF PRESENT ILLNESS:  The patient is a 77 year old white female who  underwent a right hip replacement by Dr. Lequita Halt in January 2005.  Prior to  her surgery, she had a preoperative chest x-ray which showed an incidental  left lower lobe lung lesion.  The patient denies any symptoms of cough,  unexplained weight loss, hemoptysis, shortness of breath, or chest pain.  She proceeded with surgery and has recovered quite well.  She underwent a CT  scan and followup of her lung mass, and this showed a 3.1 cm left lower lobe  mass which was spiculated and worrisome for carcinoma.  There was precarinal  and hilar adenopathy noted.  She was referred to Dr. Dewayne Shorter for further  evaluation.  She underwent a PET scan which showed increased uptake in the  left lower lobe which was felt to be a primary lung malignancy.  There was  also increased uptake in the pretracheal and left hilar lymph nodes.  She  underwent a needle biopsy which was positive for non-small-cell carcinoma.  Pulmonary function studies were performed which showed an FVC of 2.86, which  is 98% of predicted and FEV1 of 2.08, which is 102% of predicted.  It was  Dr. Scheryl Darter opinion that she should proceed with a left lower lobectomy at  this time.   PAST MEDICAL HISTORY:  1. Hypothyroidism.  2. Hypertension.  3. Osteoarthritis.   PAST SURGICAL HISTORY:  1. Right hip replacement in January 2005.  2. Hysterectomy in 1974.  3. Exploratory laparotomy for bowel obstruction in 1977.   CURRENT MEDICATIONS:  1. Synthroid 0.125 mg daily.  2. Claritin 10 mg daily.  3. Diazide 37.5 mg daily.  4. Colace 100 mg b.i.d.   ALLERGIES:  no known drug allergies. She does report an intolerance to  ALTACE which caused a cough and a rash as well as to ZOLOFT and PAXIL which  both made her nervous.   FAMILY HISTORY:  Her mother died at age 49-1/2 and had Parkinson's disease  as well as dementia.  Her father died at age 83 of lung cancer.  She has one  brother who is alive and well and has end-stage renal disease on chronic  hemodialysis.   SOCIAL HISTORY:  She is widowed and has one child.  She previously worked as  an Airline pilot.  She denies any previous or current history of alcohol or  tobacco use.  REVIEW OF SYSTEMS:  See History of Present Illness for pertinent positives  and negatives.  Also, she had a recent sinus infection for which she was  treated with antibiotics.  She wears glasses.  She has problems with chronic  constipation and takes Colace regularly.  She also has problems with  hemorrhoids.  She denies fevers, chills, weight loss, TIA symptoms, visual  changes, weakness, amaurosis fugax, syncope, dysphagia, cough, shortness of  breath, dyspnea on exertion, chest pain, palpitations, paroxysmal nocturnal  dyspnea, orthopnea, abdominal pain, nausea, vomiting, diarrhea, reflux  symptoms, hematemesis, hematochezia, melena, dysuria, nocturia, hematuria,  lower extremity edema, lower extremity claudication symptoms, anxiety,  depression, intolerance to heat or cold.   PHYSICAL EXAMINATION:  VITAL SIGNS: Blood pressure 118/72, pulse 100 and  regular, respirations 16 and unlabored.  GENERAL:  Well-developed, well-nourished white female in no acute distress.  HEENT:  Normocephalic and atraumatic.  Pupils equal, round, and reactive to  light and accommodation.  Extraocular movements intact.  Both ear canals are  occluded with cerumen.  Nares patent bilaterally.   Oropharynx is clear.  NECK:  Supple without lymphadenopathy or thyromegaly.  No carotid bruits.  LUNGS:  Clear to auscultation.  HEART:  Regular rate and rhythm without murmurs, rubs, or gallops.  ABDOMEN:  Soft, nontender, nondistended with active bowel sounds in all  quadrants.  She has a midline well-healed scar.  EXTREMITIES:  No clubbing, cyanosis, or edema.  She has palpable 2+ pedal  pulses bilaterally.  NEUROLOGIC:  Cranial nerves II-XII grossly intact.  Gait is  within normal  limits..   IMPRESSION AND PLAN:  This is a 77 year old white female with left lower  lobe cancer.  She will be admitted to [facility] on October 29, 2003,  will undergo a left VATS with left lower lobectomy by Dr. Edwyna Shell.      Coral Ceo, P.A.                        Ines Bloomer, M.D.    GC/MEDQ  D:  10/27/2003  T:  10/29/2003  Job:  [id number]   cc:   Garlon Hatchet, M.D.  [place], [place] B. Sherene Sires, M.D. [facility]   Ollen Gross, M.D.  [facility]  [address]  [address]  [place]  [place] [address]  Fax: [phone]

## 2010-12-29 NOTE — Discharge Summary (Signed)
NAME:  [name], MOEHLE NO.:  [id number]   MEDICAL RECORD NO.:  [id number]                   PATIENT TYPE:  INP   LOCATION:  2024                                 FACILITY:  [facility]   PHYSICIAN:  Ines Bloomer, M.D.              DATE OF BIRTH:  1925-04-27   DATE OF ADMISSION:  10/29/2003  DATE OF DISCHARGE:  11/03/2003                                 DISCHARGE SUMMARY   DATE OF SURGERY:  10/29/2003   ADMISSION DIAGNOSIS:  Left lower lobe lung mass.   PAST MEDICAL HISTORY:  1. Hypothyroidism.  2. Hypertension.  3. Osteoarthritis.   SURGICAL HISTORY:  1. Right hip replacement, January 2005.  2. Hysterectomy, 1974.  3. Exploratory laparotomy for bowel obstruction, 1977.   She is not allergic to any medications.  She does have intolerance to  ALTACE.  It causes cough and rash as well as ZOLOFT and PAXIL which both  made her nervous.   DISCHARGE DIAGNOSES:  Invasive adenocarcinoma, moderately differentiated,  3.2 cm, status post left lower lobectomy.   BRIEF HISTORY:  The patient is a 77 year old Caucasian female.  Prior to  undergoing right hip replacement in January 2005, she had a preoperative  chest x-ray which revealed an incidental left lower lobe lung lesion.  She  was asymptomatic.  She proceeded with her hip surgery and recovered very  well.  She underwent a CT scan to follow up her lung mass, and this showed a  3.1 cm left lower lobe mass.  It was spiculated and worrisome for carcinoma.  There was precarinal and hilar adenopathy noted.  She was referred to  Dr. Edwyna Shell for further evaluation.  She also underwent a PET scan which  showed increased left uptake in the left lower lobe consistent with primary  lung malignancy.  She underwent a needle biopsy which was positive for  nonsmall cell carcinoma.  Pulmonary function studies were performed and  showed end field FVC 2.86.  There is 98% of predicted in FEV1 of 2.08 which  is 102% of  predicted.  Dr. Edwyna Shell evaluated her at the [facility] office, and after examination of the  patient and review of all records, he recommended proceeding with left lower  lobectomy.  The procedure, risks, and benefits were all discussed with Ms.  Lacount.  She agreed to proceed with surgery.   HOSPITAL COURSE:  On Dec 29, 2003, Ms. Leece was electively admitted to  [facility] under the care of  Dr. Edwyna Shell.  She underwent the following surgical procedures:  1. Left video-assisted thoracoscopy, left thoracotomy, left lower lobectomy.  2. Lymph node dissection.  3. Placement of On-Q analgesia device.   Ms. Wuertz tolerated the procedures well. Transferred in stable condition to  the PACU.  She was extubated immediately following the surgery.  She awoke  from anesthesia neurologically intact.  Preliminary pathology findings for  intraoperative frozen section revealed adenocarcinoma.  Ms. Kovatch' postoperative course was uneventful.  She made good progress  recovering from her surgery.  Oncology made a courtesy visit while she was  here.  She is scheduled for outpatient visit with them.   FINAL PATHOLOGY DIAGNOSIS:  Invasive adenocarcinoma, moderately  differentiated, 3.2 cm, with extensive lymphovascular invasion identified.  Bronchial margins negative for tumor.  Tumor extends into but not through  the overlying visceral pleura.  One of two lymph nodes were involved by  metastatic carcinoma.  This is coded at T2, N2, MX.   A CT scan of the head performed November 01, 2003, was negative for metastatic  lesions.   She also underwent a total body bone scan prior to discharge, but the  results are not immediately available.   On November 03, 2003, on morning rounds, Ms. Rudden reported feeling well.  Her  vital signs were stable.  She was afebrile.  Room air saturations 95-98%.  Her incisions were all healing well.  Her lungs were clear to auscultation.  Heart maintained normal sinus  rhythm.  Her abdominal exam was benign.  Ms.  Diefenderfer was deemed stable for discharge on November 03, 2003.   CONDITION ON DISCHARGE:  Improved.   INSTRUCTIONS ON DISCHARGE:   MEDICATIONS:  She is to resume her home medicines.  1. Synthroid 125 mcg daily.  2. Claritin 10 mg daily.  3. Diazide 37.5 mg daily.  4. Colace 100 mg b.i.d.  5. For pain management, she may use Tylox 1-2 p.o. q.4-6 h. p.r.n.   ACTIVITY:  She has been asked to refrain from any heavy lifting, exertion,  and no driving.  She is to increase her walking as she is able.   WOUND CARE:  She is to shower daily with mild soap and water.   FOLLOWUP:  Dr. Edwyna Shell would like to see her in the office in approximately  one week after discharge.  The office will call with a date and time for  that appointment.  She is asked to have a chest x-ray TBC one hour prior to  that appointment.      Toribio Harbour, N.P.                  Ines Bloomer, M.D.    CTK/MEDQ  D:  01/12/2004  T:  01/13/2004  Job:  [id number]   cc:   Garlon Hatchet, Dr.  Marisue Humble, [place] B. Sherene Sires, M.D. [facility]   Ollen Gross, M.D.  [facility]  [address]  [address]  [place]  [place] [address]  Fax: [phone]

## 2010-12-29 NOTE — Op Note (Signed)
NAME:  [name], [name] NO.:  [id number]   MEDICAL RECORD NO.:  [id number]                   PATIENT TYPE:  INP   LOCATION:  3314                                 FACILITY:  [facility]   PHYSICIAN:  Ines Bloomer, M.D.              DATE OF BIRTH:  Jan 02, 1925   DATE OF PROCEDURE:  DATE OF DISCHARGE:                                 OPERATIVE REPORT   PREOPERATIVE DIAGNOSIS:  Adenocarcinoma of the left lower lobe.   POSTOPERATIVE DIAGNOSIS:  Adenocarcinoma fo the left lower lobe.   PROCEDURE:  Left thoracotomy and left lower lobectomy.   SURGEON:  Ines Bloomer, M.D.   FIRST ASSISTANT:  Toribio Harbour, N.P.   ANESTHESIA:  General anesthesia.   After insertion of all monitoring lines, the patient underwent general  anesthesia and was turned in the left lateral thoracotomy position.  A dual  lumen tube was inserted. She was prepped and draped in the usual sterile  manner.  Two trocar sites were made in the anterior and posterior axillary  7th intercostal space.  Two trocars were inserted.  A 30-degree scope was  inserted and the lesion was seen in the posterior basilar segment of the  left lower lobe.  It extended up into the superior segment . The 30-degree  scope was removed and a posterolateral thoracotomy was made over the 6th  intercostal space.  The latissimus was partially divided.  The __________  was reflected anteriorly.  The 6th intercostal space was entered and two  __________  were placed at right angles.  The inferior pulmonary ligament  was taken down with electrocautery, dissecting out several 9-L nodes. Then  dissection was carried superiorly dissecting out 11-L and 10-L nodes from  the posterior bronchus.  One of them was enlarged and there was worry that  this may be metastatic disease.  Attention was turned medially and  dissecting out the mediastinum. The inferior pulmonary vein was dissected  out.  Attention was turned to the  fissure and dissection was carried down to  the pulmonary artery and dissection was carried superiorly and inferiorly  and the fissure was divided with the EZ-45 stapler with one application  superiorly and one inferiorly.  This exposed the pulmonary artery which was  looped with a vascular tape and reflected laterally, dissected out some more  11-L nodes, one off the left lingular bronchus.  The nodes were then  dissected out.  The artery was divided with the Auto Suture 30 Roticulator  and then the inferior pulmonary vein was divided with the Auto Suture 30  Roticulator.  The bronchus was then stapled with the TA-30 and divided  distally.  The lung was expanded and checked for air leaks.  The bronchus  was oversewn with interrupted 4-0 Prolene, and then a pleural flap was  placed on top of the bronchus and sutured in place with 2-0 silk.  A  Marcaine  block was done in the usual fashion.  Two chest tubes were placed  in the  trocar sites and tied in place with 1-0 silk.  The chest was closed with 3  pericostals of #1 Vicryl, the muscle layer, and 2-0 Vicryl in the  subcutaneous tissue, and Dermabond for the skin.  The patient was returned  to the recovery room in stable condition.                                               Ines Bloomer, M.D.    DPB/MEDQ  D:  10/29/2003  T:  11/01/2003  Job:  [id number]   cc:   Charlaine Dalton. Sherene Sires, M.D. [facility]

## 2011-01-02 NOTE — Discharge Summary (Signed)
NAME:  ELLAREE, GEAR NO.:  [id number]   MEDICAL RECORD NO.:  [id number]                   PATIENT TYPE:  INP   LOCATION:  0476                                 FACILITY:  [facility]   PHYSICIAN:  Ollen Gross, M.D.                 DATE OF BIRTH:  09/30/1924   DATE OF ADMISSION:  08/16/2003  DATE OF DISCHARGE:  08/19/2003                                 DISCHARGE SUMMARY   ADMISSION DIAGNOSES:  1. Osteoarthritis right hip.  2. History of episodic depression between 1999 and 2002.  3. Hypertension.  4. Constipation.  5. Hemorrhoids.  6. Urinary incontinence.  7. Hypothyroidism.  8. Postmenopausal.   DISCHARGE DIAGNOSES:  1. Osteoarthritis right hip status post right total hip arthroplasty.  2. Pulmonary nodule on preoperative chest x-ray left lower lobe     approximately 4 cm in size.  3. History of episodic depression between 1999 and 2002.  4. Hypertension.  5. Constipation.  6. Hemorrhoids.  7. Urinary incontinence.  8. Hypothyroidism.  9. Postmenopausal.  10.      Mild postoperative blood loss anemia.   CONSULTATIONS:  1. Rehabilitation services.  2. Pulmonary consult, Dr. Sherene Sires.   BRIEF HISTORY:  The patient is a 77 year old female with known history of  end-stage osteoarthritis of the right hip.  Her pain has been refractory to  nonoperative management and she now presents for a right total hip  arthroplasty.  Please note in her preoperative workup that her chest x-ray  did show a 4 cm left lower lobe nodule which was confirmed by CT suspicious  for a neoplasm.  This was discussed preoperatively with the patient and her  son.  Due to the significant level of her pain she wished to go ahead and  proceed with her planned surgery and it was decided we would consult  pulmonary during her hospital course.   LABORATORY DATA:  CBC on Admission:  Hemoglobin 12.2, hematocrit 36.1, white  cell count 4.4, red cell count 3.8, differential within  normal limits.  Postop H&H 9.6 and 28.3.  Last noted H&H 9.2 and 27.1.  PT/PTT preop 12.7  and 34 respectively with an INR of 0.9.  Serial pro times followed.  Last  noted PT/INR 16 and 1.4.  Chem Panel on Admission:  Minimally elevated BUN  of 25.  Remaining chem panel all within normal limits.  Serial BMETs were  followed.  Glucose did go up from 102 to 139, back down to 137.  Electrolytes remained within normal limits.  Preop UA:  Moderate leucocyte  esterase, few epithelial's, 3-6 white cells, and few bacteria.  Blood group  type A positive.   EKG dated August 10, 2003:  Normal sinus rhythm, left atrial enlargement,  borderline EKG.  No old tracing to compare confirmed by Orpah Cobb, M.D.  Right hip films preop dated August 10, 2003:  Severe osteoarthritic  changes associated with her  right hip, L3-4 degenerative disk space  narrowing noted.  Preop chest x-ray two-view on August 10, 2003:  There is  a 4 cm mass seen within the posterior portion of the left lower lobe  worrisome for carcinoma of the lung, recommend CT for follow up, heart  normal size and no mediastinal abnormalities, right lung is clear.   The patient did have a CT scan.  I do not have the report on her chart.  The  4 cm mass was confirmed by CT noted in pulmonary's note.   HOSPITAL COURSE:  The patient was admitted to [facility], taken to  the operating room and underwent the above-stated procedure without  complication.  The patient tolerated the procedure well and later was sent  to the recovery room and then to the orthopedic floor for continued postop  care.  Vital signs were followed.  The patient was placed back on her home  meds.  Given 24 hours of postop IV antibiotics.  PT and OT were consulted  postop to assist with her gait training, ambulation, and ADL's.  Rehab was  also called.  She was placed touchdown weightbearing.  Due to the  preoperative mass on her chest x-ray and CT, a  pulmonary consult was called.  Hemovac drain placed at the time of surgery was pulled postoperative without  difficulty.  She was seen in consultation on August 17, 2003 by Dr. Sherene Sires and  due to the significant nodule it was felt that she would need outpatient  follow up and further workup of the pulmonary nodule.  She was stable at  that time and therefore it was recommended that she proceed with her rehab  first.  By day #2, she was doing much better, had less pain.  She had been  weaned over from PCA over to p.o. meds.  PCA and IV's were discharged.  Dressing was changed.  Incision was healing well.  She was also seen by  rehab services and Dr. Riley Kill postoperatively.  He felt that with her  progression that she would likely be a good candidate to go home with home  health.  She did proceed well with physical therapy.  She was up ambulating  approximately 100 feet by day #2 and then 125 feet by day #3.  By day #3,  she was doing well.  She had been seen in rounds by Dr. Lequita Halt, progressing  well, and it was decided the patient could be discharged home at that time.   DISCHARGE PLAN:  1. The patient discharged home on August 19, 2003.  2. Discharge diagnoses please see above.  3. Discharge meds:  Percocet, Robaxin, Coumadin.  4. Diet:  Low sodium diet.  5. Follow up:  In two weeks from surgery with Dr. Lequita Halt.  Call the office     for an appointment at [phone].  She is also recommended follow up with     pulmonary and Dr. Sherene Sires in 3 weeks.  She is to contact Dr. Thurston Hole office     to set up an appointment at [phone].  6. Activity:  Touchdown weightbearing, home health PT and home health     nursing, hip precautions, may start showering.   DISPOSITION:  Discharged to home.   CONDITION ON DISCHARGE:  Improved.     Alexzandrew L. Julien Girt, P.A.              Ollen Gross, M.D.    ALP/MEDQ  D:  09/09/2003  T:  09/09/2003  Job:  [id number]   cc:   Charlaine Dalton. Sherene Sires, M.D. [facility]  Garlon Hatchet, M.D.  [place], [address]. [place], M.D.  [address]  [place]  [place] [address]  Fax: [phone]

## 2011-01-02 NOTE — H&P (Signed)
NAME:  [name], [name] NO.:  [id number]   MEDICAL RECORD NO.:  [id number]                   PATIENT TYPE:  INP   LOCATION:  0476                                 FACILITY:  [facility]   PHYSICIAN:  Ollen Gross, M.D.                 DATE OF BIRTH:  [birth year]   DATE OF ADMISSION:  08/16/2003  DATE OF DISCHARGE:  08/19/2003                                HISTORY & PHYSICAL   DATE OF OFFICE VISIT AND HISTORY & PHYSICAL:  August 10, 2003.   CHIEF COMPLAINT:  Right hip pain.   HISTORY OF PRESENT ILLNESS:  The patient is a 77 year old female who has  been seen by Dr. Lequita Halt in evaluation for right groin and thigh pain. It  has been ongoing for about two years now. No specific injury leading up to  this. The pain is located in the groin and radiates down anteriorly toward  the knee. She is not getting any paresthesias or weakness. The pain started  to progress and started to interfere with certain activities. She is seen in  the office where x-rays were taken. Radiographs showed bone-on-bone changes  of the right hip with cystic formation in the femoral head. This has been  ongoing for some time.  It is felt she has reached a point where the most  predictable means of improvement in her function would be a total hip  replacement.  Risks and benefits of this procedure have been discussed with  the patient and she elects to proceed with surgery.   ALLERGIES:  No known drug allergies.   INTOLERANCES:  ALTACE causes a dry cough. ZOLOFT and PAXIL make the patient  feel weird.   CURRENT MEDICATIONS:  1. Celebrex 200 mg p.o. b.i.d.  2. Synthroid 0.125 mg p.o. daily.  3. Allegra 180 mg daily.  4. Triamterene/hydrochlorothiazide 37.5/25 p.o. daily.  5. Tylenol 500 mg p.r.n.  6. Darvocet N-100 p.r.n.  7. Fiber supplement and stool softener.   PAST MEDICAL HISTORY:  1. Episodic depression back between 1999 and 2002.  2. Hypertension.  3. History of  hemorrhoids.  4. Constipation.  5. Urinary incontinence with some urgency.  6. Postmenopausal.  7. Hypothyroidism.   PAST SURGICAL HISTORY:  1. Hysterectomy.  2. Surgery to correct an intestinal obstruction in 1977.   SOCIAL HISTORY:  Widowed, retired Airline pilot.  Nonsmoker. No alcohol. Her  son and family will be assisting with care after her surgery. She is going  to go to her son's home, two-story home with 13 steps. Her bedroom will be  on the first floor.   FAMILY HISTORY:  Significant for lung cancer.   REVIEW OF SYSTEMS:  GENERAL: No fever, chills, night sweats. NEUROLOGIC: No  seizure, syncope, or paralysis. RESPIRATORY:  No shortness of breath,  productive cough, or hemoptysis. CARDIOVASCULAR: No chest pain, angina, or  orthopnea. GI: She does have some intermittent constipation. No nausea or  vomiting. No blood or mucus in the stool. GU: She does have some urinary  urgency. No dysuria or hematuria. MUSCULOSKELETAL: Pertinent to that of the  right hip found in the history of present illness.   PHYSICAL EXAMINATION:  VITAL SIGNS: Pulse 84, respirations 12, blood  pressure 148/82.  GENERAL: This is a 77 year old white female, thin frame, well-developed,  well-nourished, in no acute distress. She is alert, oriented, and  cooperative, and very pleasant at the time of exam.  HEENT:  Normocephalic and atraumatic. EOMs are intact. The patient does wear  glasses. Oropharynx is clear.  NECK: Supple.  CHEST: Clear.  HEART: Regular rate and rhythm.  No murmurs.  ABDOMEN: Soft, flat, nontender. Bowel sounds are present.  RECTAL/BREASTS/GENITALIA: Not done; not pertinent to the present illness.  EXTREMITIES: Significant to the right lower extremity, the right hip shows  flexion of about 90 degrees. There is no internal or external rotation, only  about 20 degrees of abduction. Motor function is intact.  Normal pulses.  She does ambulate with a shuffling antalgic gait.    IMPRESSION:  1. Osteoarthritis, right hip.  2. History of episodic depression, between 1999 and 2002.  3. Hypertension.  4. Constipation.  5. Hemorrhoids.  6. Urinary incontinence.  7. Hypothyroidism.  8. Postmenopausal.   PLAN:  The patient will be admitted to [facility] and  undergo right total hip arthroplasty. Surgery will be performed by Dr. Ollen Gross.     Alexzandrew L. Julien Girt, P.A.              Ollen Gross, M.D.    ALP/MEDQ  D:  08/20/2003  T:  08/20/2003  Job:  [id number]   cc:   Ollen Gross, M.D.  [address]  [place]  [place] [address]  Fax: [phone]   Dr. Garlon Hatchet  [place], [place]   Stacie Acres. White, M.D.  [address]  [place]  [place] [address]  Fax: [phone]

## 2011-01-02 NOTE — Op Note (Signed)
NAME:  [name], [name]                      ACCOUNT NO.:  [id number]   MEDICAL RECORD NO.:  [id number]                   PATIENT TYPE:  INP   LOCATION:  R678                                 FACILITY:  [facility]   PHYSICIAN:  Ollen Gross, M.D.                 DATE OF BIRTH:  1924-10-22   DATE OF PROCEDURE:  08/16/2003  DATE OF DISCHARGE:                                 OPERATIVE REPORT   PREOPERATIVE DIAGNOSIS:  Osteoarthritis, right hip.   POSTOPERATIVE DIAGNOSIS:  Osteoarthritis, right hip.   OPERATION/PROCEDURE:  Right total hip arthroplasty.   SURGEON:  Ollen Gross, M.D.   ASSISTANT:  Alexzandrew L. Julien Girt, P.A.   ANESTHESIA:  Spinal.   ESTIMATED BLOOD LOSS:  Less than 200 mL.   DRAINS:  Hemovac x1.   COMPLICATIONS:  None.   CONDITION:  Stable to recovery room.   BRIEF CLINICAL NOTE:  [name] is a 77 year old female with end-stage  osteoarthritis of the right hip with pain refractory to nonoperative  management.  She presents now for right total hip arthroplasty.   DESCRIPTION OF PROCEDURE:  After the successful administration of spinal  anesthetic, the patient was placed in the left lateral decubitus position  with the right side up and held with the hip positioner.  Right lower  extremity was isolated from her perineum with plastic drapes and prepped and  draped in the usual sterile fashion.  A mini posterolateral incision was  made with a 10-blade through the subcutaneous tissue to the level of the  fascia lata which was incised in line with the skin incision.  Sciatic nerve  was palpated and protected and short rotators isolated off the femur.  The  capsulectomy was then performed and the  hip dislocated.  The center of the  femoral head marked, and trial prosthesis is placed such that the center of  the trial head at the level of the center of her native femoral head.  Osteotomy line is marked on the femoral neck and osteotomy is made with an  oscillating saw.  The skin was then retracted anteriorly to gain acetabular  exposure.   The acetabular retractor is placed and osteophytes removed.  Reaming started  at 47 mm coursing in two increments to 53, then a 54 mm Pinnacle acetabular  shell was placed in the anatomic position, transfixed with two dome screws.  Trial 32 mm neutral liner was placed.   The femoral preparation was made with the canal finder and irrigation.  I  broached to size 1 then placed a size 2 high offset neck trial with a 32+1  head.  Head was reduced without standing instability, full extension,  flexion and rotation; 70 degrees of flexion and 40 degrees adduction, 90  internal rotation, then 90 degrees of flexion and 70 degrees internal  rotation.  With the right leg on top of the left, the leg lengths are equal.  The  trials were then removed and an apex hole eliminator into the acetabular  shell and 32 mm neutral Marathon liner was impacted into the shell.   We did the trial for the cement restrictor and its reamer.  Size 3 was the  most appropriate.  The size 3 restrictor was placed in the appropriate depth  in the femoral canal, and then the canal is prepared with pulse lavage.  The  cement was mixed and pressurized.  The cement was then injected into the  canal, pressurized and the size 2 high offset Endurance Luster stem is  impacted into the femoral canal matching the native anteversion.  Once the  cement was fully hardened, the 32+1 head was placed.  The hip was reduced in  the same stability parameters.  The wound was copiously irrigated with  antibiotic solution and short rotators reattached to the femur through drill  holes.  Fascia lata was closed over a Hemovac drain with interrupted #1  Vicryl, subcu closed with #1 and 2-0 Vicryl, subcuticular running 4-0  Monocryl.  Incision was cleaned and dried and Steri-Strips and a bulky  sterile dressing applied. Drain hooked to suction.  She was placed  in a knee  immobilizer, then awakened and transported to the recovery in stable  condition.                                               Ollen Gross, M.D.    FA/MEDQ  D:  08/16/2003  T:  08/16/2003  Job:  [id number]

## 2011-03-07 ENCOUNTER — Ambulatory Visit (HOSPITAL_COMMUNITY)
Admission: RE | Admit: 2011-03-07 | Discharge: 2011-03-07 | Disposition: A | Discharge status: Home / Self Care | Payer: Medicare Other | Source: Ambulatory Visit | Attending: Oncology | Admitting: Oncology

## 2011-03-07 ENCOUNTER — Encounter (HOSPITAL_COMMUNITY): Payer: Self-pay

## 2011-03-07 ENCOUNTER — Other Ambulatory Visit: Payer: Self-pay | Admitting: Oncology

## 2011-03-07 ENCOUNTER — Encounter (HOSPITAL_BASED_OUTPATIENT_CLINIC_OR_DEPARTMENT_OTHER): Payer: Medicare Other | Admitting: Oncology

## 2011-03-07 DIAGNOSIS — R0602 Shortness of breath: Secondary | ICD-10-CM | POA: Insufficient documentation

## 2011-03-07 DIAGNOSIS — K8020 Calculus of gallbladder without cholecystitis without obstruction: Secondary | ICD-10-CM | POA: Insufficient documentation

## 2011-03-07 DIAGNOSIS — Z5181 Encounter for therapeutic drug level monitoring: Secondary | ICD-10-CM

## 2011-03-07 DIAGNOSIS — K7689 Other specified diseases of liver: Secondary | ICD-10-CM | POA: Insufficient documentation

## 2011-03-07 DIAGNOSIS — J90 Pleural effusion, not elsewhere classified: Secondary | ICD-10-CM | POA: Insufficient documentation

## 2011-03-07 DIAGNOSIS — R911 Solitary pulmonary nodule: Secondary | ICD-10-CM | POA: Insufficient documentation

## 2011-03-07 DIAGNOSIS — C3490 Malignant neoplasm of unspecified part of unspecified bronchus or lung: Secondary | ICD-10-CM | POA: Insufficient documentation

## 2011-03-07 HISTORY — DX: Essential (primary) hypertension: I10

## 2011-03-07 HISTORY — DX: Malignant (primary) neoplasm, unspecified: C80.1

## 2011-03-07 LAB — CBC WITH DIFFERENTIAL/PLATELET
Basophils Absolute: 0 10*3/uL (ref 0.0–0.1)
EOS%: 5.5 % (ref 0.0–7.0)
Eosinophils Absolute: 0.2 10*3/uL (ref 0.0–0.5)
LYMPH%: 14.8 % (ref 14.0–49.7)
MCH: 29.1 pg (ref 25.1–34.0)
MCV: 88.1 fL (ref 79.5–101.0)
MONO%: 12.7 % (ref 0.0–14.0)
NEUT#: 1.9 10*3/uL (ref 1.5–6.5)
Platelets: 178 10*3/uL (ref 145–400)
RBC: 3.88 10*6/uL (ref 3.70–5.45)

## 2011-03-07 LAB — CMP (CANCER CENTER ONLY)
Alkaline Phosphatase: 155 U/L — ABNORMAL HIGH (ref 26–84)
BUN, Bld: 15 mg/dL (ref 7–22)
Glucose, Bld: 101 mg/dL (ref 73–118)
Sodium: 137 mEq/L (ref 128–145)
Total Bilirubin: 0.6 mg/dl (ref 0.20–1.60)

## 2011-03-07 MED ORDER — IOHEXOL 300 MG/ML  SOLN
80.0000 mL | Freq: Once | INTRAMUSCULAR | Status: AC | PRN
  Administered 2011-03-07: 80 mL via INTRAVENOUS

## 2011-03-08 ENCOUNTER — Encounter (HOSPITAL_BASED_OUTPATIENT_CLINIC_OR_DEPARTMENT_OTHER): Payer: Medicare Other | Admitting: Oncology

## 2011-03-08 DIAGNOSIS — C3430 Malignant neoplasm of lower lobe, unspecified bronchus or lung: Secondary | ICD-10-CM

## 2011-07-11 ENCOUNTER — Other Ambulatory Visit (HOSPITAL_BASED_OUTPATIENT_CLINIC_OR_DEPARTMENT_OTHER): Payer: Medicare Other | Admitting: Lab

## 2011-07-11 ENCOUNTER — Ambulatory Visit (HOSPITAL_COMMUNITY)
Admission: RE | Admit: 2011-07-11 | Discharge: 2011-07-11 | Disposition: A | Discharge status: Home / Self Care | Payer: Medicare Other | Source: Ambulatory Visit | Attending: Oncology | Admitting: Oncology

## 2011-07-11 ENCOUNTER — Other Ambulatory Visit: Payer: Self-pay | Admitting: Oncology

## 2011-07-11 ENCOUNTER — Ambulatory Visit (HOSPITAL_BASED_OUTPATIENT_CLINIC_OR_DEPARTMENT_OTHER): Payer: Medicare Other | Admitting: Physician Assistant

## 2011-07-11 ENCOUNTER — Encounter: Payer: Self-pay | Admitting: Physician Assistant

## 2011-07-11 ENCOUNTER — Telehealth: Payer: Self-pay | Admitting: *Deleted

## 2011-07-11 VITALS — BP 151/78 | HR 102 | Temp 97.6°F | Ht 66.5 in | Wt 139.5 lb

## 2011-07-11 DIAGNOSIS — C3490 Malignant neoplasm of unspecified part of unspecified bronchus or lung: Secondary | ICD-10-CM | POA: Insufficient documentation

## 2011-07-11 DIAGNOSIS — I10 Essential (primary) hypertension: Secondary | ICD-10-CM | POA: Insufficient documentation

## 2011-07-11 DIAGNOSIS — E039 Hypothyroidism, unspecified: Secondary | ICD-10-CM

## 2011-07-11 DIAGNOSIS — C50919 Malignant neoplasm of unspecified site of unspecified female breast: Secondary | ICD-10-CM

## 2011-07-11 DIAGNOSIS — M169 Osteoarthritis of hip, unspecified: Secondary | ICD-10-CM

## 2011-07-11 DIAGNOSIS — C771 Secondary and unspecified malignant neoplasm of intrathoracic lymph nodes: Secondary | ICD-10-CM

## 2011-07-11 DIAGNOSIS — C3432 Malignant neoplasm of lower lobe, left bronchus or lung: Secondary | ICD-10-CM | POA: Insufficient documentation

## 2011-07-11 DIAGNOSIS — C3430 Malignant neoplasm of lower lobe, unspecified bronchus or lung: Secondary | ICD-10-CM

## 2011-07-11 DIAGNOSIS — J984 Other disorders of lung: Secondary | ICD-10-CM | POA: Insufficient documentation

## 2011-07-11 HISTORY — DX: Malignant neoplasm of lower lobe, unspecified bronchus or lung: C34.30

## 2011-07-11 LAB — COMPREHENSIVE METABOLIC PANEL
ALT: 11 U/L (ref 0–35)
Alkaline Phosphatase: 170 U/L — ABNORMAL HIGH (ref 39–117)
Sodium: 141 mEq/L (ref 135–145)
Total Bilirubin: 0.4 mg/dL (ref 0.3–1.2)
Total Protein: 6.8 g/dL (ref 6.0–8.3)

## 2011-07-11 LAB — CBC WITH DIFFERENTIAL/PLATELET
Basophils Absolute: 0 10*3/uL (ref 0.0–0.1)
EOS%: 3.4 % (ref 0.0–7.0)
HCT: 34.2 % — ABNORMAL LOW (ref 34.8–46.6)
HGB: 11.4 g/dL — ABNORMAL LOW (ref 11.6–15.9)
MCH: 30.8 pg (ref 25.1–34.0)
MCV: 92.3 fL (ref 79.5–101.0)
NEUT%: 75.5 % (ref 38.4–76.8)
Platelets: 197 10*3/uL (ref 145–400)
lymph#: 0.4 10*3/uL — ABNORMAL LOW (ref 0.9–3.3)

## 2011-07-11 NOTE — Progress Notes (Signed)
Hematology and Oncology Follow Up Visit  [name] [id number] March 16, 1925 77 y.o. 07/11/2011 1:24 PM   Interim History:   Patient returns today accompanied by her daughter-in-law for followup of her lung carcinoma. Interval history is unremarkable, and the patient has very few complaints today. She continues to drive on her on, tries to be as active as possible, and overall tells me she is feeling well. She's had no increased shortness of breath. She has some sinus congestion and postnasal drip which causes an occasional cough, nothing that is significant or problematic. No significant phlegm production, no pleurisy. She takes Claritin which does help somewhat. She's had no fevers chills or night sweats. She's had no unexplained weight loss.  The patient continues on Tarceva, 100 mg every other day. She tolerates the Tarceva well, no rash associated with the Tarceva. She does have a rash which has been evaluated by a dermatologist, but does not appear to be associated with the Tarceva.   A chest x-ray today was reviewed by Dr. Darnelle Catalan and continues to be stable.  A detailed review of systems is otherwise noncontributory as noted below.  Review of Systems: Constitutional:  no weight loss, fever, night sweats and feels well Eyes: negative WUJ:WJXBJ congestion and nasal discharge Cardiovascular: no chest pain or dyspnea on exertion Respiratory: occasional cough, no shortness of breath, or wheezing Neurological: no TIA or stroke symptoms negative Dermatological: positive for rash on legs and ankles Gastrointestinal: no abdominal pain, change in bowel habits, or black or bloody stools Genito-Urinary: no dysuria, trouble voiding, or hematuria Hematological and Lymphatic: negative Breast: negative Musculoskeletal: negative Remaining ROS negative.  Medications: I have reviewed the patient's current medications.  Allergies:  Allergies  Allergen Reactions  . Sulfa Antibiotics Nausea  And Vomiting  . Other     Pt states antibiotics--unable to remember names of meds  . Paroxetine Hcl Anxiety  . Ramipril (Altace) Rash and Cough  . Sertraline Anxiety     Physical Exam:  Blood pressure 151/78, pulse 102, temperature 97.6 F (36.4 C), temperature source Oral, height 5' 6.5" (1.689 m), weight 139 lb 8 oz (63.277 kg). HEENT:  Sclerae anicteric, conjunctivae pink.  Oropharynx clear.  No mucositis or candidiasis.  Nodes:  No cervical, supraclavicular, or axillary lymphadenopathy palpated.  Breast Exam: Deferred.  Lungs:  Clear to auscultation bilaterally.  No crackles, rhonchi, or wheezes.  Heart:  Regular rate and rhythm.  Abdomen:  Soft, nontender.  Positive bowel sounds.  No organomegaly or masses palpated.  Musculoskeletal:  No focal spinal tenderness to palpation.  Extremities:  Benign.  No peripheral edema or cyanosis.  Skin:  Benign.  Neuro:  Nonfocal.   Lab Results: Lab Results  Component Value Date   WBC 3.7* 07/11/2011   HGB 11.4* 07/11/2011   HCT 34.2* 07/11/2011   MCV 92.3 07/11/2011   PLT 197 07/11/2011   NEUTROABS 2.8 07/11/2011     Chemistry      Component Value Date/Time   NA 141 07/11/2011 0919   NA 137 03/07/2011 0844   K 4.4 07/11/2011 0919   K 4.1 03/07/2011 0844   CL 102 07/11/2011 0919   CL 102 03/07/2011 0844   CO2 30 07/11/2011 0919   CO2 28 03/07/2011 0844   BUN 22 07/11/2011 0919   BUN 15 03/07/2011 0844   CREATININE 1.33* 07/11/2011 0919   CREATININE 1.3* 03/07/2011 0844      Component Value Date/Time   CALCIUM 9.8 07/11/2011 0919   CALCIUM 9.6  03/07/2011 0844   ALKPHOS 170* 07/11/2011 0919   ALKPHOS 155* 03/07/2011 0844   AST 22 07/11/2011 0919   AST 29 03/07/2011 0844   ALT 11 07/11/2011 0919   BILITOT 0.4 07/11/2011 0919   BILITOT 0.60 03/07/2011 0844            Radiological Studies: Chest xray earlier today showed emphysematous changes with postsurgical changes in the left upper  lobe and chronic small left pleural effusion  versus pleural  thickening at left lung base.  No acute abnormalities.   Impression and Plan: 77 year old [place] woman, status post left lower lobectomy in March 2005 for a 3.2 cm, grade 2 lung adenocarcinoma with multiple positive lymph nodes. Treated adjuvantly with carboplatin, etoposide, and radiation, completed in August 2005. In July 2006, she had multiple bilateral lung lesions documented to be increasing in size. At that time she was started on erlotinib. She continued to have stable disease, and the or lot node was stopped for brief three-month period to see if stability persisted. She had definite disease growth during that period and the erlotinib was resumed. Continues now at 100 mg every other day with good tolerance and stable disease.  Patient continues to do well and will continue on her current dose of Tarceva, 100 mg every other day. We will continue to see her per Dr. Darnelle Catalan plan on an every 4 month basis. She returned for a chest x-ray and followup appointment in March. In July she'll be seen again and will be due for a repeat chest CT at that time.  The patient continues to be followed by Dr. Emilia Beck for her hypothyroidism, and per his request we will repeat her TSH when she returns in March and we'll fax him a copy of the results.  This plan was reviewed with the patient, who voices understanding and agreement.  She knows to call with any changes or problems.    Zain Bingman, PA-C 11/28/20121:24 PM

## 2011-07-11 NOTE — Telephone Encounter (Signed)
gave patient appointment for 10-2011. gave patient instructions on getting walkin for ther chest x-ray on 11-08-2011

## 2011-08-22 ENCOUNTER — Other Ambulatory Visit: Payer: Self-pay | Admitting: *Deleted

## 2011-08-22 DIAGNOSIS — C342 Malignant neoplasm of middle lobe, bronchus or lung: Secondary | ICD-10-CM

## 2011-08-22 MED ORDER — ERLOTINIB HCL 100 MG PO TABS: 100.0000 mg | ORAL_TABLET | ORAL | Status: DC

## 2011-08-22 NOTE — Telephone Encounter (Signed)
Faxed refill request received for Tarceva.  Request to MD for review.

## 2011-08-23 ENCOUNTER — Other Ambulatory Visit: Payer: Self-pay

## 2011-08-23 NOTE — Telephone Encounter (Signed)
Received fax request for refill on pt's Tarceva.   Per system, this was sent electronically 08/22/11.  Called [facility] [phone], and spoke with Dennie Bible- this was received and filled today.

## 2011-11-08 ENCOUNTER — Other Ambulatory Visit (HOSPITAL_BASED_OUTPATIENT_CLINIC_OR_DEPARTMENT_OTHER): Payer: Medicare Other | Admitting: Lab

## 2011-11-08 ENCOUNTER — Ambulatory Visit (HOSPITAL_COMMUNITY)
Admission: RE | Admit: 2011-11-08 | Discharge: 2011-11-08 | Disposition: A | Discharge status: Home / Self Care | Payer: Medicare Other | Source: Ambulatory Visit | Attending: Physician Assistant | Admitting: Physician Assistant

## 2011-11-08 ENCOUNTER — Ambulatory Visit (HOSPITAL_BASED_OUTPATIENT_CLINIC_OR_DEPARTMENT_OTHER): Payer: Medicare Other | Admitting: Oncology

## 2011-11-08 ENCOUNTER — Telehealth: Payer: Self-pay | Admitting: Oncology

## 2011-11-08 ENCOUNTER — Other Ambulatory Visit: Payer: Self-pay | Admitting: Oncology

## 2011-11-08 VITALS — BP 146/73 | HR 111 | Temp 97.9°F | Ht 66.5 in | Wt 139.3 lb

## 2011-11-08 DIAGNOSIS — C3490 Malignant neoplasm of unspecified part of unspecified bronchus or lung: Secondary | ICD-10-CM

## 2011-11-08 DIAGNOSIS — I10 Essential (primary) hypertension: Secondary | ICD-10-CM | POA: Insufficient documentation

## 2011-11-08 DIAGNOSIS — J449 Chronic obstructive pulmonary disease, unspecified: Secondary | ICD-10-CM | POA: Insufficient documentation

## 2011-11-08 DIAGNOSIS — C3430 Malignant neoplasm of lower lobe, unspecified bronchus or lung: Secondary | ICD-10-CM

## 2011-11-08 DIAGNOSIS — Z923 Personal history of irradiation: Secondary | ICD-10-CM | POA: Insufficient documentation

## 2011-11-08 DIAGNOSIS — J4489 Other specified chronic obstructive pulmonary disease: Secondary | ICD-10-CM | POA: Insufficient documentation

## 2011-11-08 DIAGNOSIS — E039 Hypothyroidism, unspecified: Secondary | ICD-10-CM

## 2011-11-08 DIAGNOSIS — Z902 Acquired absence of lung [part of]: Secondary | ICD-10-CM | POA: Insufficient documentation

## 2011-11-08 LAB — COMPREHENSIVE METABOLIC PANEL
AST: 19 U/L (ref 0–37)
Alkaline Phosphatase: 166 U/L — ABNORMAL HIGH (ref 39–117)
BUN: 22 mg/dL (ref 6–23)
Creatinine, Ser: 1.34 mg/dL — ABNORMAL HIGH (ref 0.50–1.10)
Potassium: 3.8 mEq/L (ref 3.5–5.3)
Total Bilirubin: 0.3 mg/dL (ref 0.3–1.2)

## 2011-11-08 LAB — CBC WITH DIFFERENTIAL/PLATELET
Basophils Absolute: 0 10*3/uL (ref 0.0–0.1)
EOS%: 3.6 % (ref 0.0–7.0)
HGB: 10.9 g/dL — ABNORMAL LOW (ref 11.6–15.9)
LYMPH%: 10.2 % — ABNORMAL LOW (ref 14.0–49.7)
MCH: 29.2 pg (ref 25.1–34.0)
MCV: 88.2 fL (ref 79.5–101.0)
MONO%: 8.9 % (ref 0.0–14.0)
RDW: 16.1 % — ABNORMAL HIGH (ref 11.2–14.5)

## 2011-11-08 NOTE — Telephone Encounter (Signed)
gve the pt her sept 2013 appt calendar along with the cxr referral

## 2011-11-08 NOTE — Progress Notes (Signed)
ID: DYSTANY DUFFY   DOB: Aug 17, 1924  MR#: [id number]  ZDG#:[id number]  HISTORY OF PRESENT ILLNESS: [name] had a total right hip under Dr. Ollen Gross in January 2005.  As part of the preoperative work-up, she had a chest x-ray which showed a left lower lobe nodule.  CT of the chest was obtained [name] on 08-12-03) and this was a 3.1 cm. left lower lobe mass concerning for a primary bronchogenic carcinoma.  There was also precarinal and right hilar lymphadenopathy.  The patient was referred to Dr. Sandrea Hughs for further evaluation and, after appropriate discussion, he set her up for a PET scan and appointment with myself and Dr. Edwyna Shell.  He also set her up for a fine needle aspiration of the left lung mass.  This was performed on 09-24-03 and showed non-small cell lung carcinoma.  The patient's subsequent treatments are as detailed below.   INTERVAL HISTORY: [name] returns today with her son [name] for followup of her lung cancer. The interval history is generally unremarkable. She continues to receive the erlotinib/Tarceva through a special grant, and this was just renewed for another year.  REVIEW OF SYSTEMS: She has aches and pains year and there particularly involving the right hip which is the one of course which was replaced. She describes herself as moderately tired. She is losing her hearing she thinks. She has been a little bit unsteady on her walking, egg and because of the right hip issue, and she fell about a month ago without injuring herself. She does have a walking stick at home which she uses sometimes. He has some urinary leakage issues, some easy bruising, some low back pain which is chronic, but in general a detailed review of systems was entirely stable and she has had no cough phlegm production pleurisy or shortness of breath other than and minimal sinus symptoms which are seasonal.  PAST MEDICAL HISTORY: Past Medical History  Diagnosis Date  . lung ca dx'd 08/2003   chemo/xrt comp 2004-01-26; tarceva ongoing  . Hypertension   . Hypothyroidism   . Lung cancer, lower lobe 07/11/2011  Significant for osteoarthritis, history of depression, history of hypertension, history of hypothyroidism, history of remote right breast biopsy (benign), status post total right hip replacement in January of this year with very good results so far, status post hysterectomy and bilateral salpingo-oophorectomy, status post appendectomy, status post remote exploratory laparotomy for small bowel obstruction, status post tonsillectomy and adenoidectomy, and status post benign skin biopsy.     FAMILY HISTORY The patient's father died from lung cancer at the age of 70.  He lived six months after diagnosis.  He was a heavy smoker.  The patient's mother died at 99-1/2 years.  The patient has one brother who is on hemodialysis, apparently for nephrotic syndrome.    GYNECOLOGIC HISTORY: She is P1.  She had her hysterectomy in 01/25/1973.  She took hormones from that time until 01-26-03    SOCIAL HISTORY: She is an Airline pilot.  Her husband died in 1995-01-26, apparently a cardiac death.  The patient lives by herself with her "yorkie" dog.  Her son is [name], who works as a Emergency planning/management officer for [employer], a Engineer, civil (consulting).  [name], [name], who is a Academic librarian, is present today.  [name] and [name] have three sons.  [name] is a presbyterian.     ADVANCED DIRECTIVES:  HEALTH MAINTENANCE: History  Substance Use Topics  . Smoking status: Not on file  .  Smokeless tobacco: Not on file  . Alcohol Use: Not on file     Colonoscopy:  PAP:  Bone density:  Lipid panel:  Allergies  Allergen Reactions  . Sulfa Antibiotics Nausea And Vomiting  . Other     Pt states antibiotics--unable to remember names of meds  . Paroxetine Hcl Anxiety  . Ramipril (Altace) Rash and Cough  . Sertraline Anxiety    Current Outpatient Prescriptions  Medication Sig Dispense Refill  . acetaminophen (TYLENOL) 325  MG tablet Take 650 mg by mouth every 6 (six) hours as needed.        Marland Kitchen amLODipine (NORVASC) 5 MG tablet Take 5 mg by mouth Daily.      Marland Kitchen aspirin 81 MG tablet Take 81 mg by mouth daily.        . Calcium Carbonate-Vitamin D (CALCIUM + D PO) Take 1 tablet by mouth 2 (two) times daily.      Marland Kitchen erlotinib (TARCEVA) 100 MG tablet Take 1 tablet (100 mg total) by mouth every other day.  15 tablet  2  . furosemide (LASIX) 20 MG tablet Take 20 mg by mouth daily.      Marland Kitchen levothyroxine (SYNTHROID, LEVOTHROID) 100 MCG tablet Take 100 mcg by mouth daily.        Marland Kitchen loratadine (CLARITIN) 10 MG tablet Take 10 mg by mouth daily.        . Magnesium 100 MG CAPS Take 1 capsule by mouth daily.      . meclizine (ANTIVERT) 12.5 MG tablet Take 12.5 mg by mouth as needed.      . polyethylene glycol (MIRALAX / GLYCOLAX) packet Take 17 g by mouth as needed.        . vitamin B-12 (CYANOCOBALAMIN) 1000 MCG tablet Take 1,000 mcg by mouth daily.          OBJECTIVE: Elderly white woman who appears well Filed Vitals:   11/08/11 1052  BP: 146/73  Pulse: 111  Temp: 97.9 F (36.6 Ramos)     Body mass index is 22.15 kg/(m^2).    ECOG FS: 1  Sclerae unicteric Oropharynx clear No peripheral adenopathy Lungs no rales or rhonchi Heart rapid but regular rate and rhythm, 2/6 systolic murmur as previously noted Abd benign MSK no focal spinal tenderness, no peripheral edema Neuro: nonfocal   LAB RESULTS: Lab Results  Component Value Date   WBC 3.6* 11/08/2011   NEUTROABS 2.8 11/08/2011   HGB 10.9* 11/08/2011   HCT 32.8* 11/08/2011   MCV 88.2 11/08/2011   PLT 212 11/08/2011      Chemistry      Component Value Date/Time   NA 137 11/08/2011 0924   NA 137 03/07/2011 0844   K 3.8 11/08/2011 0924   K 4.1 03/07/2011 0844   CL 102 11/08/2011 0924   CL 102 03/07/2011 0844   CO2 26 11/08/2011 0924   CO2 28 03/07/2011 0844   BUN 22 11/08/2011 0924   BUN 15 03/07/2011 0844   CREATININE 1.34* 11/08/2011 0924   CREATININE 1.3* 03/07/2011 0844        Component Value Date/Time   CALCIUM 9.6 11/08/2011 0924   CALCIUM 9.6 03/07/2011 0844   ALKPHOS 166* 11/08/2011 0924   ALKPHOS 155* 03/07/2011 0844   AST 19 11/08/2011 0924   AST 29 03/07/2011 0844   ALT 11 11/08/2011 0924   BILITOT 0.3 11/08/2011 0924   BILITOT 0.60 03/07/2011 0844       No results found for this basename: LABCA2  No components found with this basename: [id number]    No results found for this basename: INR:1;PROTIME:1 in the last 168 hours  Urinalysis No results found for this basename: colorurine, appearanceur, labspec, phurine, glucoseu, hgbur, bilirubinur, ketonesur, proteinur, urobilinogen, nitrite, leukocytesur    STUDIES: Dg Chest 2 View  11/08/2011  *RADIOLOGY REPORT*  Clinical Data: History of lung cancer.  Assess response to therapy and evaluate for progression.  Partial lobectomy in 2005. Hypertension  CHEST - 2 VIEW  Comparison: 07/11/2011, 11/15/2010 and CT 03/07/2011  Findings: Overall increase in lung volume is noted compatible with underlying COPD.  Postsurgical and postradiation changes of the left hemithorax and left hilum appear unchanged in comparison with prior exams from 2012.  No new focal infiltrates, signs of congestive failure or focal nodularity is identified. Blunting of the left costophrenic angle posteriorly is stable correlating with chronic left effusion  Bony structures appear intact.  IMPRESSION:  Stable postoperative and post therapeutic appearance to the left chest with no new focal or acute abnormality suggested.  Original Report Authenticated By: Bertha Stakes, M.D.    ASSESSMENT:78 year old [place] woman, status post left lower lobectomy in March 2005 for a 3.2 cm, grade 2 lung adenocarcinoma with multiple positive lymph nodes. Treated adjuvantly with carboplatin, etoposide, and radiation, completed in August 2005. In July 2006, she had multiple bilateral lung lesions documented to be increasing in size. At that time she was  started on erlotinib. After a period of stable disease, the erlotinib (Tarceva) was stopped for a three-month period to see if stability persisted. She had definite disease growth during that period and the erlotinib was resumed. Continues now at 100 mg every other day with good tolerance and stable disease.   PLAN: I am broadening the followup interval to 6 months. She will have lab work and a chest x-ray each visit. I am delighted that she continues to receive is very expensive medication essentially for free. She knows to call for any problems that may develop before the next visit and particularly if any pulmonary problems develop.   [name]    11/08/2011

## 2011-11-20 ENCOUNTER — Other Ambulatory Visit: Payer: Self-pay | Admitting: *Deleted

## 2011-11-20 NOTE — Telephone Encounter (Signed)
THIS REQUEST WAS GIVEN TO DR.MAGRINAT'S NURSE, VAL DODD,RN. 

## 2011-11-22 ENCOUNTER — Other Ambulatory Visit: Payer: Self-pay | Admitting: *Deleted

## 2011-11-22 DIAGNOSIS — C3490 Malignant neoplasm of unspecified part of unspecified bronchus or lung: Secondary | ICD-10-CM

## 2011-11-22 DIAGNOSIS — C342 Malignant neoplasm of middle lobe, bronchus or lung: Secondary | ICD-10-CM

## 2011-11-22 MED ORDER — ERLOTINIB HCL 100 MG PO TABS: 100.0000 mg | ORAL_TABLET | ORAL | Status: DC

## 2011-12-27 ENCOUNTER — Other Ambulatory Visit: Payer: Self-pay | Admitting: *Deleted

## 2011-12-27 DIAGNOSIS — C342 Malignant neoplasm of middle lobe, bronchus or lung: Secondary | ICD-10-CM

## 2011-12-27 MED ORDER — ERLOTINIB HCL 100 MG PO TABS: 100.0000 mg | ORAL_TABLET | ORAL | Status: DC

## 2012-01-28 ENCOUNTER — Telehealth: Payer: Self-pay | Admitting: *Deleted

## 2012-01-28 DIAGNOSIS — C342 Malignant neoplasm of middle lobe, bronchus or lung: Secondary | ICD-10-CM

## 2012-01-28 MED ORDER — ERLOTINIB HCL 100 MG PO TABS: 100.0000 mg | ORAL_TABLET | ORAL | Status: DC

## 2012-01-28 NOTE — Telephone Encounter (Signed)
Patient called reporting she may not receive her Tarceva through local retail anymore.  Reports having a grant/claim (number [id number]) through [employer] [facility] [phone]).  "Antonio" instructed her to obtain Tarceva through [facility] [phone] /F [phone].  Instructed to have MD send script for Tarceva 100 mg, take 1 QOD, qty.15 tabs because she takes this every other day.  This nurse will send an eRx to [facility].

## 2012-01-31 ENCOUNTER — Telehealth: Payer: Self-pay | Admitting: *Deleted

## 2012-01-31 NOTE — Telephone Encounter (Signed)
[facility] faxed referral status update.  At this time, Tarceva refill is too soon until 02-13-12.  Last order at local pharmacy but she will be using [facility] for future fills.  Will notify providers.

## 2012-05-08 ENCOUNTER — Ambulatory Visit (HOSPITAL_COMMUNITY)
Admission: RE | Admit: 2012-05-08 | Discharge: 2012-05-08 | Disposition: A | Discharge status: Home / Self Care | Payer: Medicare Other | Source: Ambulatory Visit | Attending: Oncology | Admitting: Oncology

## 2012-05-08 ENCOUNTER — Telehealth: Payer: Self-pay | Admitting: Oncology

## 2012-05-08 ENCOUNTER — Ambulatory Visit: Payer: Medicare Other | Admitting: Physician Assistant

## 2012-05-08 ENCOUNTER — Ambulatory Visit (HOSPITAL_BASED_OUTPATIENT_CLINIC_OR_DEPARTMENT_OTHER): Payer: Medicare Other | Admitting: Physician Assistant

## 2012-05-08 ENCOUNTER — Other Ambulatory Visit (HOSPITAL_BASED_OUTPATIENT_CLINIC_OR_DEPARTMENT_OTHER): Payer: Medicare Other | Admitting: Lab

## 2012-05-08 ENCOUNTER — Encounter: Payer: Self-pay | Admitting: Physician Assistant

## 2012-05-08 VITALS — BP 162/72 | HR 99 | Temp 97.6°F | Resp 20 | Ht 66.5 in | Wt 134.8 lb

## 2012-05-08 DIAGNOSIS — C3430 Malignant neoplasm of lower lobe, unspecified bronchus or lung: Secondary | ICD-10-CM

## 2012-05-08 DIAGNOSIS — C3490 Malignant neoplasm of unspecified part of unspecified bronchus or lung: Secondary | ICD-10-CM | POA: Insufficient documentation

## 2012-05-08 LAB — CBC WITH DIFFERENTIAL/PLATELET
BASO%: 0.6 % (ref 0.0–2.0)
EOS%: 2.5 % (ref 0.0–7.0)
HCT: 34 % — ABNORMAL LOW (ref 34.8–46.6)
HGB: 11 g/dL — ABNORMAL LOW (ref 11.6–15.9)
MCHC: 32.4 g/dL (ref 31.5–36.0)
MCV: 87.3 fL (ref 79.5–101.0)
NEUT#: 3 10*3/uL (ref 1.5–6.5)
Platelets: 207 10*3/uL (ref 145–400)
RBC: 3.9 10*6/uL (ref 3.70–5.45)
RDW: 17.2 % — ABNORMAL HIGH (ref 11.2–14.5)
WBC: 4 10*3/uL (ref 3.9–10.3)
lymph#: 0.4 10*3/uL — ABNORMAL LOW (ref 0.9–3.3)

## 2012-05-08 LAB — COMPREHENSIVE METABOLIC PANEL (CC13)
ALT: 11 U/L (ref 0–55)
AST: 20 U/L (ref 5–34)
Albumin: 3.8 g/dL (ref 3.5–5.0)
Calcium: 10 mg/dL (ref 8.4–10.4)
Chloride: 104 mEq/L (ref 98–107)
Potassium: 4.7 mEq/L (ref 3.5–5.1)

## 2012-05-08 NOTE — Telephone Encounter (Signed)
gve the pt her march 2014 appt calendar °

## 2012-05-08 NOTE — Patient Instructions (Signed)
Continue Tarceva, 100mg  every other day -- No change to plan.  Return in 6 months for labs, chest xray and follow up visit (late Mar/early April 2014)  Call with questions or problems  [phone]

## 2012-05-08 NOTE — Progress Notes (Signed)
ID: ROSEBELLE EDGEWORTH   DOB: 10/13/1924  MR#: [id number]  WUJ#:[id number]  HISTORY OF PRESENT ILLNESS: Ms. Shurn had a total right hip under Dr. Ollen Gross in January 2005.  As part of the preoperative work-up, she had a chest x-ray which showed a left lower lobe nodule.  CT of the chest was obtained [facility] on 08-12-03) and this was a 3.1 cm. left lower lobe mass concerning for a primary bronchogenic carcinoma.  There was also precarinal and right hilar lymphadenopathy.  The patient was referred to Dr. Sandrea Hughs for further evaluation and, after appropriate discussion, he set her up for a PET scan and appointment with myself and Dr. Edwyna Shell.  He also set her up for a fine needle aspiration of the left lung mass.  This was performed on 09-24-03 and showed non-small cell lung carcinoma.  The patient's subsequent treatments are as detailed below.   INTERVAL HISTORY: Marisue Ivan returns today with her son Renella Cunas for followup of her lung cancer.  She continues to receive the erlotinib through a special grant, and is taking 100 mg every other day with excellent tolerance.   The interval history is generally unremarkable. 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  REVIEW OF SYSTEMS:  Leba has had no recent illnesses and denies fevers or chills. She has chronic sinus congestion and occasional vertigo, neither of which aren't new or different. She's had no skin changes or rashes. She denies any abnormal bleeding but does bruise easily. She's eating and drinking well, with no nausea or change in bowel or bladder habits. No cough, phlegm production, pleurisy, or shortness of breath. She also denies any chest pain or palpitations. She's had no abnormal headaches or dizziness. She denies any unusual myalgias or arthralgias, and has had no peripheral swelling.  A  detailed review of systems is otherwise stable and noncontributory.   PAST MEDICAL HISTORY: Past Medical History  Diagnosis Date  . lung ca dx'd 08/2003    chemo/xrt comp Jan 11, 2004; tarceva ongoing  . Hypertension   . Hypothyroidism   . Lung cancer, lower lobe 07/11/2011  Significant for osteoarthritis, history of depression, history of hypertension, history of hypothyroidism, history of remote right breast biopsy (benign), status post total right hip replacement in January of this year with very good results so far, status post hysterectomy and bilateral salpingo-oophorectomy, status post appendectomy, status post remote exploratory laparotomy for small bowel obstruction, status post tonsillectomy and adenoidectomy, and status post benign skin biopsy.     FAMILY HISTORY The patient's father died from lung cancer at the age of 96.  He lived six months after diagnosis.  He was a heavy smoker.  The patient's mother died at 99-1/2 years.  The patient has one brother who is on hemodialysis, apparently for nephrotic syndrome.    GYNECOLOGIC HISTORY: She is P1.  She had her hysterectomy in 01/10/73.  She took hormones from that time until 01-11-2003    SOCIAL HISTORY: She is an Airline pilot.  Her husband died in 11-Jan-1995, apparently a cardiac death.  The patient lives by herself with her "yorkie" dog.  Her son is Truddie Hidden, who works as a Emergency planning/management officer for [employer], a Engineer, civil (consulting).  [name], Juliette Alcide, who is a Academic librarian, is present today.  Truddie Hidden and Juliette Alcide have three sons.  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  ADVANCED DIRECTIVES:  HEALTH MAINTENANCE: History  Substance Use Topics  . Smoking status: Never Smoker   . Smokeless tobacco: Never Used  . Alcohol Use: No     Colonoscopy:  PAP:  Bone density:  Lipid panel:  Allergies  Allergen Reactions  . Sulfa Antibiotics Nausea And Vomiting  . Other     Pt states antibiotics--unable to remember names of meds  . Paroxetine Hcl Anxiety  .  Ramipril (Ramipril) Rash and Cough  . Sertraline Anxiety    Current Outpatient Prescriptions  Medication Sig Dispense Refill  . acetaminophen (TYLENOL) 325 MG tablet Take 650 mg by mouth every 6 (six) hours as needed.        Marland Kitchen amLODipine (NORVASC) 5 MG tablet Take 5 mg by mouth Daily.      Marland Kitchen aspirin 81 MG tablet Take 81 mg by mouth daily.        . Calcium Carbonate-Vitamin D (CALCIUM + D PO) Take 1 tablet by mouth 2 (two) times daily.      Marland Kitchen erlotinib (TARCEVA) 100 MG tablet Take 1 tablet (100 mg total) by mouth every other day.  45 tablet  4  . furosemide (LASIX) 20 MG tablet Take 20 mg by mouth daily.      Marland Kitchen levothyroxine (SYNTHROID, LEVOTHROID) 100 MCG tablet Take 100 mcg by mouth daily.        Marland Kitchen loratadine (CLARITIN) 10 MG tablet Take 10 mg by mouth daily.        . Magnesium 100 MG CAPS Take 1 capsule by mouth daily.      . polyethylene glycol (MIRALAX / GLYCOLAX) packet Take 17 g by mouth as needed.        . vitamin B-12 (CYANOCOBALAMIN) 1000 MCG tablet Take 1,000 mcg by mouth daily.        . meclizine (ANTIVERT) 12.5 MG tablet Take 12.5 mg by mouth as needed.        OBJECTIVE: Elderly white woman who appears well Filed Vitals:   05/08/12 1136  BP: 162/72  Pulse: 99  Temp: 97.6 F (36.4 C)  Resp: 20     Body mass index is 21.43 kg/(m^2).    ECOG FS: 1  Sclerae unicteric Oropharynx clear No peripheral adenopathy Lungs clear to auscultation with no rales or rhonchi Heart regular rate and rhythm, systolic murmur as previously noted Abd soft, nontender with positive bowel sounds MSK no focal spinal tenderness, no peripheral edema Neuro: nonfocal, alert and oriented x3   LAB RESULTS: Lab Results  Component Value Date   WBC 4.0 05/08/2012   NEUTROABS 3.0 05/08/2012   HGB 11.0* 05/08/2012   HCT 34.0* 05/08/2012   MCV 87.3 05/08/2012   PLT 207 05/08/2012      Chemistry      Component Value Date/Time   NA 137 05/08/2012 1018   NA 137 11/08/2011 0924   NA 137 03/07/2011 0844    K 4.7 05/08/2012 1018   K 3.8 11/08/2011 0924   K 4.1 03/07/2011 0844   CL 104 05/08/2012 1018   CL 102 11/08/2011 0924   CL 102 03/07/2011 0844   CO2 24 05/08/2012 1018   CO2 26 11/08/2011 0924   CO2 28 03/07/2011 0844   BUN 16.0 05/08/2012 1018   BUN 22 11/08/2011 0924   BUN 15 03/07/2011 0844   CREATININE 1.3* 05/08/2012 1018   CREATININE 1.34* 11/08/2011 0924   CREATININE 1.3* 03/07/2011 0844      Component Value Date/Time   CALCIUM  10.0 05/08/2012 1018   CALCIUM 9.6 11/08/2011 0924   CALCIUM 9.6 03/07/2011 0844   ALKPHOS 157* 05/08/2012 1018   ALKPHOS 166* 11/08/2011 0924   ALKPHOS 155* 03/07/2011 0844   AST 20 05/08/2012 1018   AST 19 11/08/2011 0924   AST 29 03/07/2011 0844   ALT 11 05/08/2012 1018   ALT 11 11/08/2011 0924   BILITOT 0.30 05/08/2012 1018   BILITOT 0.3 11/08/2011 0924   BILITOT 0.60 03/07/2011 0844       STUDIES:  Dg Chest 2 View  05/08/2012  *RADIOLOGY REPORT*  Clinical Data: Lung cancer, follow-up  CHEST - 2 VIEW  Comparison: 11/08/2011  Findings: Stable heart size and pulmonary vascularity. Atherosclerotic calcification of a mildly tortuous thoracic aorta. Persistent opacity at AP window and left hilum unchanged since 05/16/2010, likely sequela of prior therapy for lung cancer. Persistent pleural thickening or fluid at inferior left hemithorax. Underlying emphysematous changes. Remaining lungs clear. No pneumothorax or acute osseous findings.  IMPRESSION: Changes of COPD with stable post-therapy changes in left hemithorax. No acute abnormalities.   Original Report Authenticated By: Lollie Marrow, M.D.      ASSESSMENT:78 year old [place] woman,   (1)  status post left lower lobectomy in March 2005 for a 3.2 cm, grade 2 lung adenocarcinoma with multiple positive lymph nodes.   (2)  Treated adjuvantly with carboplatin, etoposide, and radiation, completed in August 2005.   (3)  In July 2006, she had multiple bilateral lung lesions documented to be increasing in size. At  that time she was started on erlotinib. After a period of stable disease, the erlotinib (Tarceva) was stopped for a three-month period to see if stability persisted. She had definite disease growth during that period and the erlotinib was resumed. Continues now at 100 mg every other day with good tolerance and stable disease.   PLAN:  Dema continues to do extremely well with regards to her lung cancer. She will continue on her current regimen with no changes, specifically continue on Tarceva at 100 mg every other day. She will return in 6 months for repeat labs, chest x-ray, and a followup with Dr. Darnelle Catalan. She voices her understanding, and of course knows to call with any changes or problems.   Darrly Loberg    05/08/2012

## 2012-10-16 ENCOUNTER — Encounter: Payer: Self-pay | Admitting: Oncology

## 2012-10-16 NOTE — Progress Notes (Signed)
Received letter from Patient [employer].  Pt is approved for assistance for Tarceva.  It's in the form of reimbursement for cost sharing, eligibility period of 10/15/12 to 10/14/13 or when benefit cap has been met.  The amount of the grant is $7500.00.  I will give letter to Medical records to be scanned in pt's chart.

## 2012-11-10 ENCOUNTER — Ambulatory Visit (HOSPITAL_COMMUNITY)
Admission: RE | Admit: 2012-11-10 | Discharge: 2012-11-10 | Disposition: A | Discharge status: Home / Self Care | Payer: Medicare Other | Source: Ambulatory Visit | Attending: Physician Assistant | Admitting: Physician Assistant

## 2012-11-10 ENCOUNTER — Telehealth: Payer: Self-pay | Admitting: *Deleted

## 2012-11-10 ENCOUNTER — Other Ambulatory Visit: Payer: Medicare Other | Admitting: Lab

## 2012-11-10 ENCOUNTER — Ambulatory Visit (HOSPITAL_COMMUNITY): Payer: Medicare Other

## 2012-11-10 ENCOUNTER — Other Ambulatory Visit (HOSPITAL_BASED_OUTPATIENT_CLINIC_OR_DEPARTMENT_OTHER): Payer: Medicare Other | Admitting: Lab

## 2012-11-10 ENCOUNTER — Ambulatory Visit (HOSPITAL_BASED_OUTPATIENT_CLINIC_OR_DEPARTMENT_OTHER): Payer: Medicare Other | Admitting: Oncology

## 2012-11-10 ENCOUNTER — Ambulatory Visit: Payer: Medicare Other | Admitting: Oncology

## 2012-11-10 DIAGNOSIS — C3430 Malignant neoplasm of lower lobe, unspecified bronchus or lung: Secondary | ICD-10-CM

## 2012-11-10 DIAGNOSIS — R6889 Other general symptoms and signs: Secondary | ICD-10-CM | POA: Insufficient documentation

## 2012-11-10 DIAGNOSIS — I10 Essential (primary) hypertension: Secondary | ICD-10-CM | POA: Insufficient documentation

## 2012-11-10 DIAGNOSIS — J90 Pleural effusion, not elsewhere classified: Secondary | ICD-10-CM | POA: Insufficient documentation

## 2012-11-10 DIAGNOSIS — C3490 Malignant neoplasm of unspecified part of unspecified bronchus or lung: Secondary | ICD-10-CM | POA: Insufficient documentation

## 2012-11-10 LAB — CBC WITH DIFFERENTIAL/PLATELET
BASO%: 0.7 % (ref 0.0–2.0)
EOS%: 5.1 % (ref 0.0–7.0)
HCT: 37.5 % (ref 34.8–46.6)
MCH: 30.9 pg (ref 25.1–34.0)
MCHC: 33 g/dL (ref 31.5–36.0)
MONO%: 9.1 % (ref 0.0–14.0)
NEUT%: 75.6 % (ref 38.4–76.8)
RDW: 14.2 % (ref 11.2–14.5)
lymph#: 0.4 10*3/uL — ABNORMAL LOW (ref 0.9–3.3)

## 2012-11-10 LAB — COMPREHENSIVE METABOLIC PANEL (CC13)
ALT: 12 U/L (ref 0–55)
AST: 19 U/L (ref 5–34)
Alkaline Phosphatase: 150 U/L (ref 40–150)
Calcium: 10.1 mg/dL (ref 8.4–10.4)
Chloride: 103 mEq/L (ref 98–107)
Creatinine: 1.2 mg/dL — ABNORMAL HIGH (ref 0.6–1.1)
Total Bilirubin: 0.39 mg/dL (ref 0.20–1.20)

## 2012-11-10 NOTE — Telephone Encounter (Signed)
gv pt appts d/t .the patient requested to have her appts set up like the previous time in which was 10 lab, 10:30 cxr, and md visit@ 11:30 am. Pt is aware that i dont schedule the cxr and can not promise her that cxr time.

## 2012-11-10 NOTE — Progress Notes (Signed)
ID: [name]   DOB: [birth year]  MR#: [id number]  WUJ#:[id number]  HISTORY OF PRESENT ILLNESS: [name] had a total right hip under Dr. Ollen Ramos in January 2005.  As part of the preoperative work-up, she had a chest x-ray which showed a left lower lobe nodule.  CT of the chest was obtained [name] on 08-12-03) and this was a 3.1 cm. left lower lobe mass concerning for a primary bronchogenic carcinoma.  There was also precarinal and right hilar lymphadenopathy.  The patient was referred to Dr. Sandrea Ramos for further evaluation and, after appropriate discussion, he set her up for a PET scan and appointment with myself and Dr. Edwyna Ramos.  He also set her up for a fine needle aspiration of the left lung mass.  This was performed on 09-24-03 and showed non-small cell lung carcinoma.  The patient's subsequent treatments are as detailed below.   INTERVAL HISTORY: [name] returns today with her son [name] for followup of her lung cancer.  The interval history is significant for her sister-in-law, who lives across the street, having fractured her hip, so lives is doing quite a bit of caregiving there. 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 She was dropped from "cancer care", but picked by "patient axis", so she still is not having to pay anything for her Tarceva pills.   REVIEW OF SYSTEMS:  She has a minimal dry cough, occasionally productive of clear phlegm, shortness of breath when walking stairs, but all the symptoms are entirely stable, and there is no pleurisy, hemoptysis, fever, or worsening shortness of breath. She feels moderately fatigued. She aches here in there, but again these are not new symptoms. A detailed review of systems today was entirely stable.   PAST MEDICAL HISTORY: Past Medical History  Diagnosis Date  . lung ca dx'd 08/2003    chemo/xrt comp Jan 18, 2004; tarceva ongoing  . Hypertension   . Hypothyroidism   . Lung cancer, lower lobe 07/11/2011  Significant for osteoarthritis,  history of depression, history of hypertension, history of hypothyroidism, history of remote right breast biopsy (benign), status post total right hip replacement in January of this year with very good results so far, status post hysterectomy and bilateral salpingo-oophorectomy, status post appendectomy, status post remote exploratory laparotomy for small bowel obstruction, status post tonsillectomy and adenoidectomy, and status post benign skin biopsy.     FAMILY HISTORY The patient's father died from lung cancer at the age of 15.  He lived six months after diagnosis.  He was a heavy smoker.  The patient's mother died at 99-1/2 years.  The patient has one brother who is on hemodialysis, apparently for nephrotic syndrome.    GYNECOLOGIC HISTORY: She is P1.  She had her hysterectomy in Jan 17, 1973.  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  The patient lives by herself with her "yorkie" dog.  Her son is [name], who works as a Emergency planning/management officer for [employer], a Engineer, civil (consulting).  [name]'s wife, [name], who is a Academic librarian, is present today.  [name] and [name] have three sons.  [name] is a presbyterian.     ADVANCED DIRECTIVES:  HEALTH MAINTENANCE: History  Substance Use Topics  . Smoking status: Never Smoker   . Smokeless tobacco: Never Used  . Alcohol Use: No     Colonoscopy:  PAP:  Bone density:  Lipid panel:  Allergies  Allergen Reactions  . Sulfa Antibiotics Nausea And Vomiting  . Other     Pt states antibiotics--unable to remember names of meds  . Paroxetine Hcl Anxiety  . Ramipril (Ramipril) Rash and Cough  . Sertraline Anxiety    Current Outpatient Prescriptions  Medication Sig Dispense Refill  . acetaminophen (TYLENOL) 325 MG tablet Take 650 mg by mouth every 6 (six) hours as needed.        Marland Kitchen amLODipine (NORVASC) 5 MG tablet Take 5 mg by mouth Daily.      Marland Kitchen aspirin 81 MG  tablet Take 81 mg by mouth daily.        . Calcium Carbonate-Vitamin D (CALCIUM + D PO) Take 1 tablet by mouth 2 (two) times daily.      Marland Kitchen erlotinib (TARCEVA) 100 MG tablet Take 1 tablet (100 mg total) by mouth every other day.  45 tablet  4  . furosemide (LASIX) 20 MG tablet Take 20 mg by mouth daily.      Marland Kitchen levothyroxine (SYNTHROID, LEVOTHROID) 100 MCG tablet Take 100 mcg by mouth daily.        Marland Kitchen loratadine (CLARITIN) 10 MG tablet Take 10 mg by mouth daily.        . Magnesium 100 MG CAPS Take 1 capsule by mouth daily.      . meclizine (ANTIVERT) 12.5 MG tablet Take 12.5 mg by mouth as needed.      . polyethylene glycol (MIRALAX / GLYCOLAX) packet Take 17 g by mouth as needed.        . vitamin B-12 (CYANOCOBALAMIN) 1000 MCG tablet Take 1,000 mcg by mouth daily.         No current facility-administered medications for this visit.    OBJECTIVE: Elderly white woman in no acute distress Filed Vitals:   11/10/12 1045  BP: 145/80  Pulse: 114  Temp: 98.5 F (36.9 C)  Resp: 20     Body mass index is 21.14 kg/(m^2).    ECOG FS: 1  Sclerae unicteric Oropharynx clear No peripheral adenopathy Lungs clear to auscultation with good excursion bilaterally, some dullness to percussion at the lower left base Heart regular rate and rhythm, systolic murmur as previously noted Abd soft, nontender with positive bowel sounds MSK no focal spinal tenderness, no peripheral edema Neuro: nonfocal, well oriented, pleasant affect   LAB RESULTS: Lab Results  Component Value Date   WBC 4.2 11/10/2012   NEUTROABS 3.2 11/10/2012   HGB 12.4 11/10/2012   HCT 37.5 11/10/2012   MCV 93.8 11/10/2012   PLT 214 11/10/2012      Chemistry      Component Value Date/Time   NA 140 11/10/2012 0939   NA 137 11/08/2011 0924   NA 137 03/07/2011 0844   K 4.7 11/10/2012 0939   K 3.8 11/08/2011 0924   K 4.1 03/07/2011 0844   CL 103 11/10/2012 0939   CL 102 11/08/2011 0924   CL 102 03/07/2011 0844   CO2 27 11/10/2012 0939   CO2  26 11/08/2011 0924   CO2 28 03/07/2011 0844   BUN 19.7 11/10/2012 0939   BUN 22 11/08/2011 0924   BUN 15 03/07/2011 0844   CREATININE 1.2* 11/10/2012 0939   CREATININE 1.34* 11/08/2011 0924   CREATININE 1.3* 03/07/2011 0844      Component Value Date/Time   CALCIUM 10.1 11/10/2012 0939   CALCIUM 9.6 11/08/2011 0924   CALCIUM 9.6 03/07/2011 0844   ALKPHOS 150 11/10/2012 0939   ALKPHOS  166* 11/08/2011 0924   ALKPHOS 155* 03/07/2011 0844   AST 19 11/10/2012 0939   AST 19 11/08/2011 0924   AST 29 03/07/2011 0844   ALT 12 11/10/2012 0939   ALT 11 11/08/2011 0924   BILITOT 0.39 11/10/2012 0939   BILITOT 0.3 11/08/2011 0924   BILITOT 0.60 03/07/2011 0844       STUDIES: Dg Chest 2 View  11/10/2012  *RADIOLOGY REPORT*  Clinical Data: Lung cancer  CHEST - 2 VIEW  Comparison: May 08, 2012.  Findings: Stable cardiomediastinal silhouette.  Right lung is clear.  Stable postsurgical changes involving left hilar region. There appears be slightly increased amount of pleural thickening or pleural effusion in the left lung base.  No pneumothorax is noted. Bony thorax appears intact.  IMPRESSION: Mildly increased pleural effusion or pleural thickening is noted in left lung base.  Postsurgical changes as described above.   Original Report Authenticated By: Lupita Raider.,  M.D.      ASSESSMENT:79 y.o.  [place] woman,   (1)  status post left lower lobectomy in March 2005 for a 3.2 cm, grade 2 lung adenocarcinoma with multiple positive lymph nodes.   (2)  Treated adjuvantly with carboplatin, etoposide, and radiation, completed in August 2005.   (3)  In July 2006, she had multiple bilateral lung lesions documented to be increasing in size. At that time she was started on erlotinib. After a period of stable disease, the erlotinib (Tarceva) was stopped for a three-month period to see if stability persisted. She had definite disease growth during that period and the erlotinib was resumed. Continues now at 100 mg every  other day with good tolerance and stable disease.   PLAN:  Daizee is doing well clinically. We do need to follow the chest x-ray little bit more closely given the change it just noted, so she will see me again in 3 months. If there is further change at that time, we will try to up the Tarceva to everyday. Otherwise she knows to call for any problems that may develop before the next visit.  MAGRINAT,GUSTAV C    11/10/2012

## 2012-11-18 ENCOUNTER — Telehealth: Payer: Self-pay | Admitting: Oncology

## 2012-11-18 NOTE — Telephone Encounter (Signed)
Faxed pt medical records to [facility]

## 2013-02-09 ENCOUNTER — Other Ambulatory Visit: Payer: Self-pay | Admitting: *Deleted

## 2013-02-09 DIAGNOSIS — C342 Malignant neoplasm of middle lobe, bronchus or lung: Secondary | ICD-10-CM

## 2013-02-09 MED ORDER — ERLOTINIB HCL 100 MG PO TABS: 100.0000 mg | ORAL_TABLET | ORAL | Status: DC

## 2013-02-10 ENCOUNTER — Other Ambulatory Visit (HOSPITAL_BASED_OUTPATIENT_CLINIC_OR_DEPARTMENT_OTHER): Payer: Medicare Other | Admitting: Lab

## 2013-02-10 ENCOUNTER — Ambulatory Visit (HOSPITAL_BASED_OUTPATIENT_CLINIC_OR_DEPARTMENT_OTHER): Payer: Medicare Other | Admitting: Oncology

## 2013-02-10 ENCOUNTER — Ambulatory Visit (HOSPITAL_COMMUNITY)
Admission: RE | Admit: 2013-02-10 | Discharge: 2013-02-10 | Disposition: A | Discharge status: Home / Self Care | Payer: Medicare Other | Source: Ambulatory Visit | Attending: Oncology | Admitting: Oncology

## 2013-02-10 ENCOUNTER — Telehealth: Payer: Self-pay | Admitting: Oncology

## 2013-02-10 DIAGNOSIS — C3490 Malignant neoplasm of unspecified part of unspecified bronchus or lung: Secondary | ICD-10-CM

## 2013-02-10 DIAGNOSIS — C50919 Malignant neoplasm of unspecified site of unspecified female breast: Secondary | ICD-10-CM | POA: Insufficient documentation

## 2013-02-10 DIAGNOSIS — C3430 Malignant neoplasm of lower lobe, unspecified bronchus or lung: Secondary | ICD-10-CM

## 2013-02-10 DIAGNOSIS — Z9181 History of falling: Secondary | ICD-10-CM

## 2013-02-10 LAB — COMPREHENSIVE METABOLIC PANEL (CC13)
ALT: 10 U/L (ref 0–55)
AST: 18 U/L (ref 5–34)
BUN: 13.9 mg/dL (ref 7.0–26.0)
Creatinine: 1.1 mg/dL (ref 0.6–1.1)
Total Bilirubin: 0.36 mg/dL (ref 0.20–1.20)

## 2013-02-10 LAB — CBC WITH DIFFERENTIAL/PLATELET
BASO%: 0.8 % (ref 0.0–2.0)
Basophils Absolute: 0 10*3/uL (ref 0.0–0.1)
EOS%: 4.2 % (ref 0.0–7.0)
HCT: 36.1 % (ref 34.8–46.6)
LYMPH%: 9.5 % — ABNORMAL LOW (ref 14.0–49.7)
MCH: 30.9 pg (ref 25.1–34.0)
MCHC: 33.9 g/dL (ref 31.5–36.0)
MCV: 91.3 fL (ref 79.5–101.0)
MONO%: 8.2 % (ref 0.0–14.0)
NEUT%: 77.3 % — ABNORMAL HIGH (ref 38.4–76.8)
Platelets: 190 10*3/uL (ref 145–400)

## 2013-02-10 NOTE — Progress Notes (Signed)
ID: [name]   DOB: July 15, 1925  MR#: [id number]  WUJ#:[id number]  HISTORY OF PRESENT ILLNESS: [name] had a total right hip under [name] in January 2005.  As part of the preoperative work-up, she had a chest x-ray which showed a left lower lobe nodule.  CT of the chest was obtained [name] on 08-12-03) and this was a 3.1 cm. left lower lobe mass concerning for a primary bronchogenic carcinoma.  There was also precarinal and right hilar lymphadenopathy.  The patient was referred to [name] for further evaluation and, after appropriate discussion, he set her up for a PET scan and appointment with myself and [name].  He also set her up for a fine needle aspiration of the left lung mass.  This was performed on 09-24-03 and showed non-small cell lung carcinoma.  The patient's subsequent treatments are as detailed below.   INTERVAL HISTORY: [name] returns today with her son [name] for followup of her lung cancer. [name] is receiving her erlotinib indirectly through courtesy of the company. She is tolerating it well, with no significant skin or bowel issues. Her 25 year old grandson is staying with her the summer while he works locally.  REVIEW OF SYSTEMS:  She describes herself is fatigued, but has a normal level of activity I think for someone her age. She does have hip and hand pain. She takes Tylenol 2 or 3 times daily for this. Just a little bit of a runny nose and some sinus problems for which she takes Claritin in the morning. Sometimes her right ankle swells. Sometimes she has a little bit of a dry cough. She has stress urinary incontinence. All these problems are stable. They're not more intense or frequent than prior. A detailed review of systems was otherwise unchanged  PAST MEDICAL HISTORY: Past Medical History  Diagnosis Date  . lung ca dx'd 08/2003    chemo/xrt comp 2003-12-31; tarceva ongoing  . Hypertension   . Hypothyroidism   . Lung cancer, lower lobe 07/11/2011   Significant for osteoarthritis, history of depression, history of hypertension, history of hypothyroidism, history of remote right breast biopsy (benign), status post total right hip replacement in January of this year with very good results so far, status post hysterectomy and bilateral salpingo-oophorectomy, status post appendectomy, status post remote exploratory laparotomy for small bowel obstruction, status post tonsillectomy and adenoidectomy, and status post benign skin biopsy.     FAMILY HISTORY The patient's father died from lung cancer at the age of 35.  He lived six months after diagnosis.  He was a heavy smoker.  The patient's mother died at 99-1/2 years.  The patient has one brother who is on hemodialysis, apparently for nephrotic syndrome.    GYNECOLOGIC HISTORY: She is P1.  She had her hysterectomy in Dec 30, 1972.  She took hormones from that time until 12-31-02    SOCIAL HISTORY: She is an Airline pilot.  Her husband died in 31-Dec-1994, apparently a cardiac death. The patient lives by herself with her "yorkie" dog.  Her son is [name], who works as a Emergency planning/management officer for [employer], a Engineer, civil (consulting).  Gina wife, Juliette Ramos, who is a Academic librarian, is present today.  [name] and Juliette Ramos have three sons.  [name] is a presbyterian.     ADVANCED DIRECTIVES: In place  HEALTH MAINTENANCE: History  Substance Use Topics  . Smoking status: Never Smoker   . Smokeless tobacco: Never Used  . Alcohol Use: No  Colonoscopy:  PAP:  Bone density:  Lipid panel:  Allergies  Allergen Reactions  . Sulfa Antibiotics Nausea And Vomiting  . Other     Pt states antibiotics--unable to remember names of meds  . Paroxetine Hcl Anxiety  . Ramipril (Ramipril) Rash and Cough  . Sertraline Anxiety    Current Outpatient Prescriptions  Medication Sig Dispense Refill  . acetaminophen (TYLENOL) 325 MG tablet Take 650 mg by mouth every 6 (six) hours as needed.        Marland Kitchen amLODipine (NORVASC) 5 MG tablet Take 5  mg by mouth Daily.      Marland Kitchen aspirin 81 MG tablet Take 81 mg by mouth daily.        . Calcium Carbonate-Vitamin D (CALCIUM + D PO) Take 1 tablet by mouth 2 (two) times daily.      Marland Kitchen erlotinib (TARCEVA) 100 MG tablet Take 1 tablet (100 mg total) by mouth every other day.  45 tablet  4  . furosemide (LASIX) 20 MG tablet Take 20 mg by mouth daily.      Marland Kitchen levothyroxine (SYNTHROID, LEVOTHROID) 100 MCG tablet Take 100 mcg by mouth daily.        Marland Kitchen loratadine (CLARITIN) 10 MG tablet Take 10 mg by mouth daily.        . Magnesium 100 MG CAPS Take 1 capsule by mouth daily.      . meclizine (ANTIVERT) 12.5 MG tablet Take 12.5 mg by mouth as needed.      . polyethylene glycol (MIRALAX / GLYCOLAX) packet Take 17 g by mouth as needed.        . vitamin B-12 (CYANOCOBALAMIN) 1000 MCG tablet Take 1,000 mcg by mouth daily.         No current facility-administered medications for this visit.    OBJECTIVE: Elderly white woman in no acute distress Filed Vitals:   02/10/13 1114  BP: 171/81  Pulse: 101  Temp: 98.9 F (37.2 C)  Resp: 20     Body mass index is 20.99 kg/(m^2).    ECOG FS: 1  Sclerae unicteric Oropharynx clear No peripheral adenopathy Lungs clear to auscultation with good excursion bilaterally, some dullness to percussion at the lower left base, unchanged Heart regular rate and rhythm, systolic murmur as previously noted Abd soft, nontender, with positive bowel sounds MSK no focal spinal tenderness, no peripheral edema Neuro: nonfocal, well oriented, pleasant affect   LAB RESULTS: Lab Results  Component Value Date   WBC 4.2 02/10/2013   NEUTROABS 3.2 02/10/2013   HGB 12.2 02/10/2013   HCT 36.1 02/10/2013   MCV 91.3 02/10/2013   PLT 190 02/10/2013      Chemistry      Component Value Date/Time   NA 139 02/10/2013 0932   NA 137 11/08/2011 0924   NA 137 03/07/2011 0844   K 4.5 02/10/2013 0932   K 3.8 11/08/2011 0924   K 4.1 03/07/2011 0844   CL 103 11/10/2012 0939   CL 102 11/08/2011 0924   CL 102  03/07/2011 0844   CO2 28 02/10/2013 0932   CO2 26 11/08/2011 0924   CO2 28 03/07/2011 0844   BUN 13.9 02/10/2013 0932   BUN 22 11/08/2011 0924   BUN 15 03/07/2011 0844   CREATININE 1.1 02/10/2013 0932   CREATININE 1.34* 11/08/2011 0924   CREATININE 1.3* 03/07/2011 0844      Component Value Date/Time   CALCIUM 10.1 02/10/2013 0932   CALCIUM 9.6 11/08/2011 0924   CALCIUM 9.6  03/07/2011 0844   ALKPHOS 154* 02/10/2013 0932   ALKPHOS 166* 11/08/2011 0924   ALKPHOS 155* 03/07/2011 0844   AST 18 02/10/2013 0932   AST 19 11/08/2011 0924   AST 29 03/07/2011 0844   ALT 10 02/10/2013 0932   ALT 11 11/08/2011 0924   BILITOT 0.36 02/10/2013 0932   BILITOT 0.3 11/08/2011 0924   BILITOT 0.60 03/07/2011 0844       STUDIES: Dg Chest 2 View  02/10/2013   *RADIOLOGY REPORT*  Clinical Data: History of lung carcinoma  CHEST - 2 VIEW  Comparison: November 10, 2012  Findings: There is postoperative change with scarring in the left perihilar region.  There is pleural thickening with probable chronic loculated effusion at the left base, stable.  There is no new opacity on either side.  Right lung is clear.  Heart size is normal.  The pulmonary vascularity is within normal limits.  There is no appreciable adenopathy.  There are no appreciable bone lesions.  No  IMPRESSION: Stable examination.  Postoperative scarring on the left.  Question chronic loculated effusion on the left; these changes may entirely be due to chronic scarring and pleural thickening, however.   Original Report Authenticated By: Bretta Bang, M.D.     ASSESSMENT:79 y.o.  [place] woman,   (1)  status post left lower lobectomy in March 2005 for a 3.2 cm, grade 2 lung adenocarcinoma with multiple positive lymph nodes.   (2)  Treated adjuvantly with carboplatin, etoposide, and radiation, completed in August 2005.   (3)  In July 2006, she had multiple bilateral lung lesions documented to be increasing in size. At that time she was started on erlotinib. After a period  of stable disease, the erlotinib (Tarceva) was stopped for a three-month period to see if stability persisted. She had definite disease growth during that period and the erlotinib was resumed. Continues now at 100 mg every other day with good tolerance and stable disease.   PLAN:  Lives is doing fine from a lung cancer point of view. I am concerned about her falls, and I have asked her to look over her house to make sure anything she might trip over is removed. I also suggested she use her walking cane at all times. (We will review this with her the next visit).  Otherwise the plan is to continue erlotonib at 100 mg daily, indefinitely, or until there is clear evidence of disease progression. She knows to call for any problems that may develop before the next visit.  Trevyon Swor C    02/10/2013

## 2013-05-19 ENCOUNTER — Telehealth: Payer: Self-pay | Admitting: Oncology

## 2013-05-19 ENCOUNTER — Ambulatory Visit (HOSPITAL_BASED_OUTPATIENT_CLINIC_OR_DEPARTMENT_OTHER): Payer: Medicare Other | Admitting: Physician Assistant

## 2013-05-19 ENCOUNTER — Other Ambulatory Visit (HOSPITAL_BASED_OUTPATIENT_CLINIC_OR_DEPARTMENT_OTHER): Payer: Medicare Other | Admitting: Lab

## 2013-05-19 ENCOUNTER — Ambulatory Visit (HOSPITAL_COMMUNITY)
Admission: RE | Admit: 2013-05-19 | Discharge: 2013-05-19 | Disposition: A | Discharge status: Home / Self Care | Payer: Medicare Other | Source: Ambulatory Visit | Attending: Oncology | Admitting: Oncology

## 2013-05-19 ENCOUNTER — Encounter: Payer: Self-pay | Admitting: Physician Assistant

## 2013-05-19 DIAGNOSIS — C3430 Malignant neoplasm of lower lobe, unspecified bronchus or lung: Secondary | ICD-10-CM

## 2013-05-19 DIAGNOSIS — Z85118 Personal history of other malignant neoplasm of bronchus and lung: Secondary | ICD-10-CM | POA: Insufficient documentation

## 2013-05-19 DIAGNOSIS — R42 Dizziness and giddiness: Secondary | ICD-10-CM

## 2013-05-19 DIAGNOSIS — I700 Atherosclerosis of aorta: Secondary | ICD-10-CM | POA: Insufficient documentation

## 2013-05-19 DIAGNOSIS — J90 Pleural effusion, not elsewhere classified: Secondary | ICD-10-CM | POA: Insufficient documentation

## 2013-05-19 LAB — COMPREHENSIVE METABOLIC PANEL (CC13)
ALT: 11 U/L (ref 0–55)
Anion Gap: 10 mEq/L (ref 3–11)
BUN: 18 mg/dL (ref 7.0–26.0)
CO2: 26 mEq/L (ref 22–29)
Calcium: 10.1 mg/dL (ref 8.4–10.4)
Chloride: 103 mEq/L (ref 98–109)
Creatinine: 1.3 mg/dL — ABNORMAL HIGH (ref 0.6–1.1)
Total Bilirubin: 0.35 mg/dL (ref 0.20–1.20)

## 2013-05-19 LAB — CBC WITH DIFFERENTIAL/PLATELET
BASO%: 0.4 % (ref 0.0–2.0)
Basophils Absolute: 0 10*3/uL (ref 0.0–0.1)
EOS%: 1.3 % (ref 0.0–7.0)
HCT: 37 % (ref 34.8–46.6)
HGB: 12.2 g/dL (ref 11.6–15.9)
LYMPH%: 7.2 % — ABNORMAL LOW (ref 14.0–49.7)
MCH: 30.7 pg (ref 25.1–34.0)
MCHC: 32.9 g/dL (ref 31.5–36.0)
MONO#: 0.3 10*3/uL (ref 0.1–0.9)
NEUT%: 84.5 % — ABNORMAL HIGH (ref 38.4–76.8)
Platelets: 199 10*3/uL (ref 145–400)
lymph#: 0.3 10*3/uL — ABNORMAL LOW (ref 0.9–3.3)

## 2013-05-19 MED ORDER — MECLIZINE HCL 12.5 MG PO TABS: 12.5000 mg | ORAL_TABLET | Freq: Three times a day (TID) | ORAL | Status: AC | PRN

## 2013-05-19 NOTE — Telephone Encounter (Signed)
, °

## 2013-05-19 NOTE — Progress Notes (Signed)
ID: Ronie Spies   DOB: May 25, 1925  MR#: [id number]  WUJ#:[id number]  PCP:  Garlon Hatchet, MD GYN: SU: Other: Sandrea Hughs, MD  CHIEF COMPLAINT:  Lung Cancer, Left   HISTORY OF PRESENT ILLNESS: Ms. Ragen had a total right hip under Dr. Ollen Gross in January 2005.  As part of the preoperative work-up, she had a chest x-ray which showed a left lower lobe nodule.  CT of the chest was obtained [facility] on 08-12-03) and this was a 3.1 cm. left lower lobe mass concerning for a primary bronchogenic carcinoma.  There was also precarinal and right hilar lymphadenopathy.  The patient was referred to Dr. Sandrea Hughs for further evaluation and, after appropriate discussion, he set her up for a PET scan and appointment with myself and Dr. Edwyna Shell.  He also set her up for a fine needle aspiration of the left lung mass.  This was performed on 09-24-03 and showed non-small cell lung carcinoma.  The patient's subsequent treatments are as detailed below.   INTERVAL HISTORY: Marisue Ivan returns today with her son Truddie Hidden for followup of her lung cancer. Marisue Ivan continues to receive her erlotinib directly through courtesy of the company which has been extremely helpful. She continues taking 100 mg every other day with good tolerance. She occasionally has episodes of diarrhea, but this is intermittent. Her skin is dry but she's had no additional skin changes. She has occasional dry cough but denies any increased shortness of breath and has had no hemoptysis. A chest x-ray obtained earlier today was also entirely stable.  Marisue Ivan continues to walk with the assistance of a cane for stability. She's had only 1 recent fall which occurred at night in the dark,  but she is "being very careful" she tells me. For shortly she has had no injuries.  Interval history is notable for Marisue Ivan having had an ingrown toenail removed on her right great toe. She was subsequently prescribed Keflex which caused a rash, so we did make a note of this  today in her medical records. The toe itself is healing well she tells me, and she's had no additional complications.  REVIEW OF SYSTEMS:  Marisue Ivan has had no recent fevers, chills, or night sweats. She does feel fatigued. She continues to have muscle aches and joint pains, especially in the hands, knees, and back. She's had no peripheral swelling. She bruises easily. She's had some recent episodes of epistasis, especially when she blows her nose. She is able to get the bleeding stopped rather quickly. She's had no additional abnormal bleeding elsewhere. She's eating and drinking well with no nausea or emesis. No chest pain or palpitations. No abnormal headaches or dizziness, although she does feel weak.  A detailed review of systems is otherwise stable and noncontributory.  PAST MEDICAL HISTORY: Past Medical History  Diagnosis Date  . lung ca dx'd 08/2003    chemo/xrt comp 2005; tarceva ongoing  . Hypertension   . Hypothyroidism   . Lung cancer, lower lobe 07/11/2011  Significant for osteoarthritis, history of depression, history of hypertension, history of hypothyroidism, history of remote right breast biopsy (benign), status post total right hip replacement in January of this year with very good results so far, status post hysterectomy and bilateral salpingo-oophorectomy, status post appendectomy, status post remote exploratory laparotomy for small bowel obstruction, status post tonsillectomy and adenoidectomy, and status post benign skin biopsy.     FAMILY HISTORY The patient's father died from lung cancer at the age of 27.  He  lived six months after diagnosis.  He was a heavy smoker.  The patient's mother died at 99-1/2 years.  The patient has one brother who is on hemodialysis, apparently for nephrotic syndrome.    GYNECOLOGIC HISTORY: She is P1.  She had her hysterectomy in 12/23/72.  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 The patient lives by herself with her "yorkie" dog.  Her son is Truddie Hidden, who works as a Emergency planning/management officer for [employer], a Engineer, civil (consulting).  [name], Juliette Alcide,  is a Academic librarian.  Truddie Hidden and Juliette Alcide have three sons.  Ms. Phang is a presbyterian.     ADVANCED DIRECTIVES: In place  HEALTH MAINTENANCE: History  Substance Use Topics  . Smoking status: Never Smoker   . Smokeless tobacco: Never Used  . Alcohol Use: No    Colonoscopy:  PAP:  Bone density:  Lipid panel:  Allergies  Allergen Reactions  . Keflex [Cephalexin] Rash  . Sulfa Antibiotics Nausea And Vomiting  . Other     Pt states antibiotics--unable to remember names of meds  . Paroxetine Hcl Anxiety  . Ramipril [Ramipril] Rash and Cough  . Sertraline Anxiety    Current Outpatient Prescriptions  Medication Sig Dispense Refill  . acetaminophen (TYLENOL) 325 MG tablet Take 500 mg by mouth every 6 (six) hours as needed.       Marland Kitchen amLODipine (NORVASC) 5 MG tablet Take 2.5 mg by mouth Daily.       Marland Kitchen amoxicillin-clavulanate (AUGMENTIN) 875-125 MG per tablet       . aspirin 81 MG tablet Take 81 mg by mouth daily.        . Calcium Carbonate-Vitamin D (CALCIUM + D PO) Take 1 tablet by mouth 2 (two) times daily.      Marland Kitchen erlotinib (TARCEVA) 100 MG tablet Take 1 tablet (100 mg total) by mouth every other day.  45 tablet  4  . furosemide (LASIX) 20 MG tablet Take 20 mg by mouth daily as needed.       Marland Kitchen levothyroxine (SYNTHROID, LEVOTHROID) 100 MCG tablet Take 112 mcg by mouth daily.       Marland Kitchen loratadine (CLARITIN) 10 MG tablet Take 10 mg by mouth daily.        . Magnesium 100 MG CAPS Take 1 capsule by mouth daily.      . meclizine (ANTIVERT) 12.5 MG tablet Take 1 tablet (12.5 mg total) by mouth 3 (three) times daily as needed for dizziness.  30 tablet  0  . metroNIDAZOLE (METROCREAM) 0.75 % cream       . polyethylene glycol (MIRALAX / GLYCOLAX) packet Take 17 g by mouth as needed.         . vitamin B-12 (CYANOCOBALAMIN) 1000 MCG tablet Take 1,000 mcg by mouth daily.         No current facility-administered medications for this visit.    OBJECTIVE: Elderly white woman ambulating with a cane.  She appears comfortable and is in no acute distress Filed Vitals:   05/19/13 1125  BP: 154/78  Pulse: 103  Temp: 97.5 F (36.4 C)  Resp: 18     Body mass index is 20.56 kg/(m^2).    ECOG FS: 1 Filed Weights   05/19/13 1125  Weight: 127 lb 4.8 oz (57.743 kg)   Physical Exam: HEENT:  Sclerae anicteric.  Oropharynx clear.  There  is a small healing vesicle consistent with a fever blister on the left upper lip. NODES:  No cervical, axillary, or supraclavicular lymphadenopathy palpated.  BREAST EXAM:  Deferred. LUNGS:  Breath sounds are slightly diminished in the left lower base, but otherwise with good excursion bilaterally. No crackles, wheezes, or rhonchi auscultated.  HEART:  Regular rate and rhythm. Systolic murmur. ABDOMEN:  Soft, nontender.  Positive bowel sounds.  MSK:  No focal spinal tenderness to palpation.  EXTREMITIES:  No peripheral edema.   NEURO:  Nonfocal. Well oriented.  Positive affect.    LAB RESULTS: Lab Results  Component Value Date   WBC 4.8 05/19/2013   NEUTROABS 4.1 05/19/2013   HGB 12.2 05/19/2013   HCT 37.0 05/19/2013   MCV 93.3 05/19/2013   PLT 199 05/19/2013      Chemistry      Component Value Date/Time   NA 139 05/19/2013 1022   NA 137 11/08/2011 0924   NA 137 03/07/2011 0844   K 4.1 05/19/2013 1022   K 3.8 11/08/2011 0924   K 4.1 03/07/2011 0844   CL 103 11/10/2012 0939   CL 102 11/08/2011 0924   CL 102 03/07/2011 0844   CO2 26 05/19/2013 1022   CO2 26 11/08/2011 0924   CO2 28 03/07/2011 0844   BUN 18.0 05/19/2013 1022   BUN 22 11/08/2011 0924   BUN 15 03/07/2011 0844   CREATININE 1.3* 05/19/2013 1022   CREATININE 1.34* 11/08/2011 0924   CREATININE 1.3* 03/07/2011 0844      Component Value Date/Time   CALCIUM 10.1 05/19/2013 1022   CALCIUM 9.6  11/08/2011 0924   CALCIUM 9.6 03/07/2011 0844   ALKPHOS 132 05/19/2013 1022   ALKPHOS 166* 11/08/2011 0924   ALKPHOS 155* 03/07/2011 0844   AST 19 05/19/2013 1022   AST 19 11/08/2011 0924   AST 29 03/07/2011 0844   ALT 11 05/19/2013 1022   ALT 11 11/08/2011 0924   ALT 20 03/07/2011 0844   BILITOT 0.35 05/19/2013 1022   BILITOT 0.3 11/08/2011 0924   BILITOT 0.60 03/07/2011 0844       STUDIES: Dg Chest 2 View  05/19/2013   CLINICAL DATA:  History of lung cancer  EXAM: CHEST  2 VIEW  COMPARISON:  02/10/2013  FINDINGS: Heart size normal. Right lung clear. Heart and mediastinal contents shifted toward the left side. Moderate chronic appearing pleural effusion stable. Underlying left lower lobe consolidation unchanged. Aortic arch calcifications stable.  IMPRESSION: No change from prior study   Electronically Signed   By: Esperanza Heir M.D.   On: 05/19/2013 11:03     ASSESSMENT:79 y.o.  [place] woman,   (1)  status post left lower lobectomy in March 2005 for a 3.2 cm, grade 2 lung adenocarcinoma with multiple positive lymph nodes.   (2)  Treated adjuvantly with carboplatin, etoposide, and radiation, completed in August 2005.   (3)  In July 2006, she had multiple bilateral lung lesions documented to be increasing in size. At that time she was started on erlotinib. After a period of stable disease, the erlotinib (Tarceva) was stopped for a three-month period to see if stability persisted. She had definite disease growth during that period and the erlotinib was resumed. Continues now at 100 mg every other day with good tolerance and stable disease.   PLAN:  Marisue Ivan continues to do well with regards to her lung cancer, and appears stable. We're making no changes in her current treatment plan, and she will continue on  erlotinib, 100 mg every other day, indefinitely, or until there is clear evidence of disease progression. We will continue to see her every 3 months with a chest x-ray, repeat labs, and physical  exam. Her next appointment here will be in January 2015.  In the meanwhile, they know to call with any changes or problems. She and her son Truddie Hidden both voice understanding and agreement with our plan today.    Vinson Tietze    05/19/2013

## 2013-09-08 ENCOUNTER — Encounter (INDEPENDENT_AMBULATORY_CARE_PROVIDER_SITE_OTHER): Payer: Self-pay

## 2013-09-08 ENCOUNTER — Telehealth: Payer: Self-pay | Admitting: Physician Assistant

## 2013-09-08 ENCOUNTER — Ambulatory Visit (HOSPITAL_BASED_OUTPATIENT_CLINIC_OR_DEPARTMENT_OTHER): Payer: Medicare Other | Admitting: Oncology

## 2013-09-08 ENCOUNTER — Ambulatory Visit (HOSPITAL_COMMUNITY)
Admission: RE | Admit: 2013-09-08 | Discharge: 2013-09-08 | Disposition: A | Discharge status: Home / Self Care | Payer: Medicare Other | Source: Ambulatory Visit | Attending: Physician Assistant | Admitting: Physician Assistant

## 2013-09-08 ENCOUNTER — Other Ambulatory Visit (HOSPITAL_BASED_OUTPATIENT_CLINIC_OR_DEPARTMENT_OTHER): Payer: Medicare Other

## 2013-09-08 VITALS — BP 149/75 | HR 111 | Temp 97.8°F | Resp 18 | Ht 66.0 in | Wt 130.2 lb

## 2013-09-08 DIAGNOSIS — E039 Hypothyroidism, unspecified: Secondary | ICD-10-CM

## 2013-09-08 DIAGNOSIS — C3490 Malignant neoplasm of unspecified part of unspecified bronchus or lung: Secondary | ICD-10-CM | POA: Insufficient documentation

## 2013-09-08 DIAGNOSIS — C3430 Malignant neoplasm of lower lobe, unspecified bronchus or lung: Secondary | ICD-10-CM

## 2013-09-08 DIAGNOSIS — I10 Essential (primary) hypertension: Secondary | ICD-10-CM

## 2013-09-08 DIAGNOSIS — J90 Pleural effusion, not elsewhere classified: Secondary | ICD-10-CM | POA: Insufficient documentation

## 2013-09-08 DIAGNOSIS — R918 Other nonspecific abnormal finding of lung field: Secondary | ICD-10-CM | POA: Insufficient documentation

## 2013-09-08 LAB — COMPREHENSIVE METABOLIC PANEL (CC13)
ALK PHOS: 148 U/L (ref 40–150)
ALT: 15 U/L (ref 0–55)
AST: 21 U/L (ref 5–34)
Albumin: 3.7 g/dL (ref 3.5–5.0)
Anion Gap: 10 mEq/L (ref 3–11)
BILIRUBIN TOTAL: 0.41 mg/dL (ref 0.20–1.20)
BUN: 21 mg/dL (ref 7.0–26.0)
CO2: 27 mEq/L (ref 22–29)
Calcium: 10.2 mg/dL (ref 8.4–10.4)
Chloride: 102 mEq/L (ref 98–109)
Creatinine: 1.3 mg/dL — ABNORMAL HIGH (ref 0.6–1.1)
Glucose: 93 mg/dl (ref 70–140)
Potassium: 4.6 mEq/L (ref 3.5–5.1)
SODIUM: 139 meq/L (ref 136–145)
TOTAL PROTEIN: 7.3 g/dL (ref 6.4–8.3)

## 2013-09-08 LAB — CBC WITH DIFFERENTIAL/PLATELET
BASO%: 0.8 % (ref 0.0–2.0)
Basophils Absolute: 0 10*3/uL (ref 0.0–0.1)
EOS%: 3.6 % (ref 0.0–7.0)
Eosinophils Absolute: 0.2 10*3/uL (ref 0.0–0.5)
HCT: 37.3 % (ref 34.8–46.6)
HGB: 12.2 g/dL (ref 11.6–15.9)
LYMPH%: 7.5 % — AB (ref 14.0–49.7)
MCH: 30.4 pg (ref 25.1–34.0)
MCHC: 32.7 g/dL (ref 31.5–36.0)
MCV: 93.1 fL (ref 79.5–101.0)
MONO#: 0.4 10*3/uL (ref 0.1–0.9)
MONO%: 9.3 % (ref 0.0–14.0)
NEUT#: 3.4 10*3/uL (ref 1.5–6.5)
NEUT%: 78.8 % — ABNORMAL HIGH (ref 38.4–76.8)
PLATELETS: 235 10*3/uL (ref 145–400)
RBC: 4 10*6/uL (ref 3.70–5.45)
RDW: 15 % — ABNORMAL HIGH (ref 11.2–14.5)
WBC: 4.3 10*3/uL (ref 3.9–10.3)
lymph#: 0.3 10*3/uL — ABNORMAL LOW (ref 0.9–3.3)

## 2013-09-08 NOTE — Progress Notes (Signed)
ID: [name]   DOB: 1925/07/10  MR#: [id number]  QIH#:[id number]  PCP:  Robyn Haber, MD GYN: SU: Other: Christinia Gully, MD  CHIEF COMPLAINT:  Lung Cancer, Left   HISTORY OF PRESENT ILLNESS: [name] had a total right hip under Dr. Gaynelle Arabian in January 2005.  As part of the preoperative work-up, she had a chest x-ray which showed a left lower lobe nodule.  CT of the chest was obtained [name] on 08-12-03) and this was a 3.1 cm. left lower lobe mass concerning for a primary bronchogenic carcinoma.  There was also precarinal and right hilar lymphadenopathy.  The patient was referred to Dr. Christinia Gully for further evaluation and, after appropriate discussion, he set her up for a PET scan and appointment with myself and Dr. Arlyce Dice.  He also set her up for a fine needle aspiration of the left lung mass.  This was performed on 09-24-03 and showed non-small cell lung carcinoma.  The patient's subsequent treatments are as detailed below.   INTERVAL HISTORY: [name] returns today with her son [name] for followup of her lung cancer. The interval history is generally unremarkable. [name] develop some rosacea, and was prescribed "a very expensive medication." It was not approved, but she did receive other treatment another rosacea is better.  REVIEW OF SYSTEMS:  [name] continues on erlotinib with good tolerance. She does have dry skin, and uses emollients routinely. Her hair is thinning a little. She's not having any diarrhea problems. She is having some sinus drainage, and stable urinary stress incontinence issues. She is not interested in referral to a urologist at this point. She got a hearing aid in November and that helped. She is scheduled for cataract surgery "stroke". Otherwise a detailed review of systems today was entirely stable    PAST MEDICAL HISTORY: Past Medical History  Diagnosis Date  . lung ca dx'd 08/2003    chemo/xrt comp Oct 30, 2003; tarceva ongoing  . Hypertension   .  Hypothyroidism   . Lung cancer, lower lobe 07/11/2011  Significant for osteoarthritis, history of depression, history of hypertension, history of hypothyroidism, history of remote right breast biopsy (benign), status post total right hip replacement in January of this year with very good results so far, status post hysterectomy and bilateral salpingo-oophorectomy, status post appendectomy, status post remote exploratory laparotomy for small bowel obstruction, status post tonsillectomy and adenoidectomy, and status post benign skin biopsy.     FAMILY HISTORY The patient's father died from lung cancer at the age of 26.  He lived six months after diagnosis.  He was a heavy smoker.  The patient's mother died at 99-1/2 years.  The patient has one brother who is on hemodialysis, apparently for nephrotic syndrome.    GYNECOLOGIC HISTORY: She is P1.  She had her hysterectomy in 1972-10-29.  She took hormones from that time until 10/30/02    SOCIAL HISTORY: (Updated 05/19/2013) She was an Optometrist.  Her husband died in 10-30-94, apparently a cardiac death. The patient lives by herself with her "yorkie" dog.  Her son is [name], who works as a Government social research officer for [employer], a Solicitor.  [name], Rip Harbour,  is a Journalist, newspaper.  [name] and Rip Harbour have three sons.  Ms. 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     ADVANCED DIRECTIVES: In place  HEALTH MAINTENANCE: History  Substance Use Topics  . Smoking status: Never Smoker   . Smokeless tobacco: Never Used  . Alcohol Use: No    Colonoscopy:  PAP:  Bone density:  Lipid panel:  Allergies  Allergen Reactions  . Keflex [Cephalexin] Rash  . Sulfa Antibiotics Nausea And Vomiting  . Other     Pt states antibiotics--unable to remember names of meds  . Paroxetine Hcl Anxiety  . Ramipril [Ramipril] Rash and Cough  . Sertraline Anxiety    Current Outpatient Prescriptions  Medication Sig Dispense Refill  . acetaminophen (TYLENOL) 325 MG tablet Take 500 mg by mouth  every 6 (six) hours as needed.       Marland Kitchen amLODipine (NORVASC) 5 MG tablet Take 2.5 mg by mouth Daily.       Marland Kitchen amoxicillin-clavulanate (AUGMENTIN) 875-125 MG per tablet       . aspirin 81 MG tablet Take 81 mg by mouth daily.        . Calcium Carbonate-Vitamin D (CALCIUM + D PO) Take 1 tablet by mouth 2 (two) times daily.      Marland Kitchen erlotinib (TARCEVA) 100 MG tablet Take 1 tablet (100 mg total) by mouth every other day.  45 tablet  4  . furosemide (LASIX) 20 MG tablet Take 20 mg by mouth daily as needed.       Marland Kitchen levothyroxine (SYNTHROID, LEVOTHROID) 100 MCG tablet Take 112 mcg by mouth daily.       Marland Kitchen loratadine (CLARITIN) 10 MG tablet Take 10 mg by mouth daily.        . Magnesium 100 MG CAPS Take 1 capsule by mouth daily.      . meclizine (ANTIVERT) 12.5 MG tablet Take 1 tablet (12.5 mg total) by mouth 3 (three) times daily as needed for dizziness.  30 tablet  0  . metroNIDAZOLE (METROCREAM) 0.75 % cream       . polyethylene glycol (MIRALAX / GLYCOLAX) packet Take 17 g by mouth as needed.        . vitamin B-12 (CYANOCOBALAMIN) 1000 MCG tablet Take 1,000 mcg by mouth daily.         No current facility-administered medications for this visit.    OBJECTIVE: Elderly white woman in no acute distress Filed Vitals:   09/08/13 1101  BP: 149/75  Pulse: 111  Temp: 97.8 F (36.6 C)  Resp: 18     Body mass index is 21.02 kg/(m^2).    ECOG FS: 1 Filed Weights   09/08/13 1101  Weight: 130 lb 3.2 oz (59.058 kg)   Sclerae unicteric, pupils equal and reactive Oropharynx clear and moist-- good dentition No cervical or supraclavicular adenopathy Lungs no rales or rhonchi, diminished breath sounds left base as previously noted Heart regular rate and rhythm Abd soft, nontender, positive bowel sounds MSK no focal spinal tenderness, no upper extremity lymphedema Neuro: nonfocal, well oriented, appropriate affect Breasts: Deferred     LAB RESULTS: Lab Results  Component Value Date   WBC 4.3 09/08/2013    NEUTROABS 3.4 09/08/2013   HGB 12.2 09/08/2013   HCT 37.3 09/08/2013   MCV 93.1 09/08/2013   PLT 235 09/08/2013      Chemistry      Component Value Date/Time   NA 139 09/08/2013 1009   NA 137 11/08/2011 0924   NA 137 03/07/2011 0844   K 4.6 09/08/2013 1009   K 3.8 11/08/2011 0924   K 4.1 03/07/2011 0844   CL 103 11/10/2012 0939   CL 102 11/08/2011 0924   CL 102 03/07/2011 0844   CO2 27 09/08/2013 1009   CO2 26 11/08/2011 0924   CO2 28 03/07/2011 0844  BUN 21.0 09/08/2013 1009   BUN 22 11/08/2011 0924   BUN 15 03/07/2011 0844   CREATININE 1.3* 09/08/2013 1009   CREATININE 1.34* 11/08/2011 0924   CREATININE 1.3* 03/07/2011 0844      Component Value Date/Time   CALCIUM 10.2 09/08/2013 1009   CALCIUM 9.6 11/08/2011 0924   CALCIUM 9.6 03/07/2011 0844   ALKPHOS 148 09/08/2013 1009   ALKPHOS 166* 11/08/2011 0924   ALKPHOS 155* 03/07/2011 0844   AST 21 09/08/2013 1009   AST 19 11/08/2011 0924   AST 29 03/07/2011 0844   ALT 15 09/08/2013 1009   ALT 11 11/08/2011 0924   ALT 20 03/07/2011 0844   BILITOT 0.41 09/08/2013 1009   BILITOT 0.3 11/08/2011 0924   BILITOT 0.60 03/07/2011 0844       STUDIES: Formal reading of today's chest x-ray is pending, but to my it looks entirely stable.  ASSESSMENT:80 y.o.  [place] woman,   (1)  status post left lower lobectomy in March 2005 for a 3.2 cm, grade 2 lung adenocarcinoma with multiple positive lymph nodes.   (2)  Treated adjuvantly with carboplatin, etoposide, and radiation, completed in August 2005.   (3)  In July 2006, she had multiple bilateral lung lesions documented to be increasing in size. At that time she was started on erlotinib. After a period of stable disease, the erlotinib (Tarceva) was stopped for a three-month period to see if stability persisted. She had definite disease growth during that period and the erlotinib was resumed. Continues now at 100 mg every other day with good tolerance and stable disease.   PLAN:  [name] c is doing fine, with no  evidence of disease progression. We are going to see her in 4 months, which will be may and then October. At that point we will probably start seeing her on an every 6 month basis, since she has been so stable for sol.  The plan of course is going to be to continue the erlotinib, 100 mg every other day, which she still receives at no cost to herself. We will obtain lab work and a repeat chest x-ray at the next visit here. She knows to call for any problems that may develop before then.   MAGRINAT,GUSTAV C    09/08/2013

## 2013-10-26 ENCOUNTER — Other Ambulatory Visit: Payer: Self-pay | Admitting: *Deleted

## 2013-10-26 MED ORDER — ERLOTINIB HCL 100 MG PO TABS: 100.0000 mg | ORAL_TABLET | ORAL | Status: DC

## 2013-10-26 NOTE — Telephone Encounter (Signed)
This RN was contacted by Patient [name] per need for renewal for co pay assistance on Tarceva.  Per above pt needs a new prescription faxed to her mail order pharmacy which [place] [facility] states as [facility] per her call to noted number on insurance card.  Fax number for prescription given as [phone].

## 2013-10-27 ENCOUNTER — Other Ambulatory Visit: Payer: Self-pay | Admitting: *Deleted

## 2013-10-27 ENCOUNTER — Encounter: Payer: Self-pay | Admitting: Oncology

## 2013-10-27 MED ORDER — ERLOTINIB HCL 100 MG PO TABS: 100.0000 mg | ORAL_TABLET | ORAL | Status: DC

## 2013-10-27 NOTE — Progress Notes (Signed)
Received letter from Patient [employer].  Pt is approved for Tarceva from 10/15/13 to 10/15/14 or when benefit cap has been met.  The amount of the grant is $7,500.

## 2014-01-06 ENCOUNTER — Ambulatory Visit (HOSPITAL_BASED_OUTPATIENT_CLINIC_OR_DEPARTMENT_OTHER): Payer: Medicare Other | Admitting: Physician Assistant

## 2014-01-06 ENCOUNTER — Telehealth: Payer: Self-pay | Admitting: Oncology

## 2014-01-06 ENCOUNTER — Encounter: Payer: Self-pay | Admitting: Physician Assistant

## 2014-01-06 ENCOUNTER — Ambulatory Visit (HOSPITAL_COMMUNITY)
Admission: RE | Admit: 2014-01-06 | Discharge: 2014-01-06 | Disposition: A | Discharge status: Home / Self Care | Payer: Medicare Other | Source: Ambulatory Visit | Attending: Oncology | Admitting: Oncology

## 2014-01-06 ENCOUNTER — Other Ambulatory Visit (HOSPITAL_BASED_OUTPATIENT_CLINIC_OR_DEPARTMENT_OTHER): Payer: Medicare Other

## 2014-01-06 VITALS — BP 112/74 | HR 58 | Temp 98.1°F | Resp 18 | Ht 66.0 in | Wt 136.6 lb

## 2014-01-06 DIAGNOSIS — J90 Pleural effusion, not elsewhere classified: Secondary | ICD-10-CM | POA: Insufficient documentation

## 2014-01-06 DIAGNOSIS — R748 Abnormal levels of other serum enzymes: Secondary | ICD-10-CM | POA: Insufficient documentation

## 2014-01-06 DIAGNOSIS — Z85118 Personal history of other malignant neoplasm of bronchus and lung: Secondary | ICD-10-CM

## 2014-01-06 DIAGNOSIS — D649 Anemia, unspecified: Secondary | ICD-10-CM

## 2014-01-06 DIAGNOSIS — C3430 Malignant neoplasm of lower lobe, unspecified bronchus or lung: Secondary | ICD-10-CM

## 2014-01-06 LAB — COMPREHENSIVE METABOLIC PANEL (CC13)
ALT: 15 U/L (ref 0–55)
AST: 20 U/L (ref 5–34)
Albumin: 3.4 g/dL — ABNORMAL LOW (ref 3.5–5.0)
Alkaline Phosphatase: 184 U/L — ABNORMAL HIGH (ref 40–150)
Anion Gap: 11 mEq/L (ref 3–11)
BILIRUBIN TOTAL: 0.41 mg/dL (ref 0.20–1.20)
BUN: 14.5 mg/dL (ref 7.0–26.0)
CO2: 24 mEq/L (ref 22–29)
Calcium: 9.7 mg/dL (ref 8.4–10.4)
Chloride: 104 mEq/L (ref 98–109)
Creatinine: 1 mg/dL (ref 0.6–1.1)
Glucose: 99 mg/dl (ref 70–140)
Potassium: 4.1 mEq/L (ref 3.5–5.1)
SODIUM: 140 meq/L (ref 136–145)
TOTAL PROTEIN: 6.8 g/dL (ref 6.4–8.3)

## 2014-01-06 LAB — CBC WITH DIFFERENTIAL/PLATELET
BASO%: 0.8 % (ref 0.0–2.0)
Basophils Absolute: 0 10*3/uL (ref 0.0–0.1)
EOS%: 4.9 % (ref 0.0–7.0)
Eosinophils Absolute: 0.2 10*3/uL (ref 0.0–0.5)
HCT: 34.7 % — ABNORMAL LOW (ref 34.8–46.6)
HGB: 11.1 g/dL — ABNORMAL LOW (ref 11.6–15.9)
LYMPH%: 7.2 % — AB (ref 14.0–49.7)
MCH: 27.7 pg (ref 25.1–34.0)
MCHC: 32 g/dL (ref 31.5–36.0)
MCV: 86.6 fL (ref 79.5–101.0)
MONO#: 0.4 10*3/uL (ref 0.1–0.9)
MONO%: 8.4 % (ref 0.0–14.0)
NEUT#: 3.3 10*3/uL (ref 1.5–6.5)
NEUT%: 78.7 % — ABNORMAL HIGH (ref 38.4–76.8)
PLATELETS: 287 10*3/uL (ref 145–400)
RBC: 4.01 10*6/uL (ref 3.70–5.45)
RDW: 15.8 % — ABNORMAL HIGH (ref 11.2–14.5)
WBC: 4.2 10*3/uL (ref 3.9–10.3)
lymph#: 0.3 10*3/uL — ABNORMAL LOW (ref 0.9–3.3)

## 2014-01-06 NOTE — Telephone Encounter (Signed)
per pof to sch appt-gave copy of sch to pt-adv pt to come early to have Chest Xray @ [facility]-will write on sch

## 2014-01-06 NOTE — Progress Notes (Signed)
ID: [name]   DOB: 1924-09-01  MR#: [id number]  MHD#:[id number]  PCP:  Gina Haber, MD GYN: SU: Other: Gina Gully, MD  CHIEF COMPLAINT:  Lung Cancer, Left   HISTORY OF PRESENT ILLNESS: [name] had a total right hip under Dr. Gaynelle Ramos in January 2005.  As part of the preoperative work-up, she had a chest x-ray which showed a left lower lobe nodule.  CT of the chest was obtained [name] on 08-12-03) and this was a 3.1 cm. left lower lobe mass concerning for a primary bronchogenic carcinoma.  There was also precarinal and right hilar lymphadenopathy.  The patient was referred to Dr. Christinia Ramos for further evaluation and, after appropriate discussion, he set her up for a PET scan and appointment with myself and Dr. Arlyce Ramos.  He also set her up for a fine needle aspiration of the left lung mass.  This was performed on 09-24-03 and showed non-small cell lung carcinoma.  The patient's subsequent treatments are as detailed below.   INTERVAL HISTORY: [name] returns today with her son [name] for followup of her lung cancer. Overall, lives is doing well. She has had an apparent upper respiratory infection with bronchitis over the past few weeks, but is recovering. Currently, she has very little cough, and denies any phlegm production or hemoptysis. She has some shortness of breath with exertion alone, but tells me this is stable. Her chest x-ray earlier today was also stable, and this is detailed below.  [name] continues on erlotinib, 100 mg every other day, which she is tolerating extremely well. Other than her chronic rosacea, she has no new rashes. She has regular bowel movements, and denies any problems with diarrhea.    REVIEW OF SYSTEMS:  [name] denies any recent fevers, chills, night sweats, or hot flashes. She has mild fatigue. She has general muscle aches and some joint pain associated with arthritis. These are all stable. She's eating and drinking well denies any nausea or  change in bowel or bladder habits. She's had no abnormal headaches, dizziness, weakness, or change in vision. She does have a runny nose at times, and she continues to wear hearing aids. She's had no mouth ulcers or oral sensitivity. She occasionally has swelling in her feet and ankles, but this improves with elevation.  A detailed review of systems is otherwise stable and noncontributory.   PAST MEDICAL HISTORY: Past Medical History  Diagnosis Date  . lung ca dx'd 08/2003    chemo/xrt comp 2005; tarceva ongoing  . Hypertension   . Hypothyroidism   . Lung cancer, lower lobe 07/11/2011  Significant for osteoarthritis, history of depression, history of hypertension, history of hypothyroidism, history of remote right breast biopsy (benign), status post total right hip replacement in January of this year with very good results so far, status post hysterectomy and bilateral salpingo-oophorectomy, status post appendectomy, status post remote exploratory laparotomy for small bowel obstruction, status post tonsillectomy and adenoidectomy, and status post benign skin biopsy.     FAMILY HISTORY The patient's father died from lung cancer at the age of 50.  He lived six months after diagnosis.  He was a heavy smoker.  The patient's mother died at 99-1/2 years.  The patient has one brother who is on hemodialysis, apparently for nephrotic syndrome.    GYNECOLOGIC HISTORY: (Reviewed 01/06/2014) She is P1.  She had her hysterectomy in 1974.  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 The patient lives by herself with her "yorkie" dog.  Her son is [name], who works as a Government social research officer for [employer], a Solicitor.  [name]'s wife, [name],  is a Journalist, newspaper.  [name] and [name] have three sons.  [name] is a presbyterian.     ADVANCED DIRECTIVES: In place  HEALTH MAINTENANCE:  (Updated 01/06/2014) History  Substance Use Topics  . Smoking status: Never Smoker   . Smokeless tobacco: Never Used  . Alcohol Use: No    Colonoscopy: Never  PAP: Status post remote hysterectomy  Bone density: Not on file  Lipid panel: Not on file  Allergies  Allergen Reactions  . Keflex [Cephalexin] Rash  . Sulfa Antibiotics Nausea And Vomiting  . Other     Pt states antibiotics--unable to remember names of meds  . Paroxetine Hcl Anxiety  . Ramipril [Ramipril] Rash and Cough  . Sertraline Anxiety    Current Outpatient Prescriptions  Medication Sig Dispense Refill  . amLODipine (NORVASC) 5 MG tablet Take 2.5 mg by mouth Daily.       Marland Kitchen aspirin 81 MG tablet Take 81 mg by mouth daily.        . Calcium Carbonate-Vitamin D (CALCIUM + D PO) Take 1 tablet by mouth 2 (two) times daily.      Marland Kitchen erlotinib (TARCEVA) 100 MG tablet Take 1 tablet (100 mg total) by mouth every other day.  45 tablet  4  . furosemide (LASIX) 20 MG tablet Take 20 mg by mouth daily as needed.       Marland Kitchen ibuprofen (ADVIL,MOTRIN) 200 MG tablet Take 200 mg by mouth every 6 (six) hours as needed.      Marland Kitchen levothyroxine (SYNTHROID, LEVOTHROID) 112 MCG tablet Take 112 mcg by mouth daily before breakfast.      . loratadine (CLARITIN) 10 MG tablet Take 10 mg by mouth daily.        . Magnesium 100 MG CAPS Take 1 capsule by mouth daily.      . meclizine (ANTIVERT) 12.5 MG tablet Take 1 tablet (12.5 mg total) by mouth 3 (three) times daily as needed for dizziness.  30 tablet  0  . metroNIDAZOLE (METROCREAM) 0.75 % cream       . polyethylene glycol (MIRALAX / GLYCOLAX) packet Take 17 g by mouth as needed.        . vitamin B-12 (CYANOCOBALAMIN) 1000 MCG tablet Take 1,000 mcg by mouth daily.         No current facility-administered medications for this visit.    OBJECTIVE: Elderly white woman who appears comfortable and is in no acute distress Filed Vitals:   01/06/14 1035  BP: 112/74  Pulse: 58  Temp: 98.1 F (36.7 C)   Resp: 18     Body mass index is 22.06 kg/(m^2).    ECOG FS: 1 Filed Weights   01/06/14 1035  Weight: 136 lb 9.6 oz (61.961 kg)   Physical Exam: HEENT:  Sclerae anicteric.  Oropharynx clear, pink, and moist.  next supple, trachea midline. No thyromegaly.  NODES:  No cervical or supraclavicular lymphadenopathy palpated.  BREAST EXAM:  Breast exam was deferred. No axillary lymphadenopathy.  LUNGS:   Slightly diminished breath sounds bilaterally in the bases, more so on the left than the right. No significant dullness to percussion. No crackles, wheezes, or rhonchi auscultated.  HEART:  Regular rate and rhythm. No murmur  ABDOMEN:  Soft,  nontender.   no organomegaly or masses palpated. Positive bowel sounds.  MSK:  No focal spinal tenderness to palpation.  Good range of motion in the upper extremities. Patient ambulates with a cane for support and stabilization.  EXTREMITIES:   1+ pitting edema bilaterally in the lower extremities. Edema is equal bilaterally. No upper extremity edema is noted. No clubbing or cyanosis.   SKIN:   erythematous rash on the face consistent with known rosacea. No additional rashes are noted. No excessive ecchymoses. No petechiae. No pallor.  NEURO:  Nonfocal. Well oriented.  Appropriate  affect.     LAB RESULTS: Lab Results  Component Value Date   WBC 4.2 01/06/2014   NEUTROABS 3.3 01/06/2014   HGB 11.1* 01/06/2014   HCT 34.7* 01/06/2014   MCV 86.6 01/06/2014   PLT 287 01/06/2014      Chemistry      Component Value Date/Time   NA 140 01/06/2014 0914   NA 137 11/08/2011 0924   NA 137 03/07/2011 0844   K 4.1 01/06/2014 0914   K 3.8 11/08/2011 0924   K 4.1 03/07/2011 0844   CL 103 11/10/2012 0939   CL 102 11/08/2011 0924   CL 102 03/07/2011 0844   CO2 24 01/06/2014 0914   CO2 26 11/08/2011 0924   CO2 28 03/07/2011 0844   BUN 14.5 01/06/2014 0914   BUN 22 11/08/2011 0924   BUN 15 03/07/2011 0844   CREATININE 1.0 01/06/2014 0914   CREATININE 1.34* 11/08/2011 0924    CREATININE 1.3* 03/07/2011 0844      Component Value Date/Time   CALCIUM 9.7 01/06/2014 0914   CALCIUM 9.6 11/08/2011 0924   CALCIUM 9.6 03/07/2011 0844   ALKPHOS 184* 01/06/2014 0914   ALKPHOS 166* 11/08/2011 0924   ALKPHOS 155* 03/07/2011 0844   AST 20 01/06/2014 0914   AST 19 11/08/2011 0924   AST 29 03/07/2011 0844   ALT 15 01/06/2014 0914   ALT 11 11/08/2011 0924   ALT 20 03/07/2011 0844   BILITOT 0.41 01/06/2014 0914   BILITOT 0.3 11/08/2011 0924   BILITOT 0.60 03/07/2011 0844       STUDIES:  Dg Chest 2 View 01/06/2014   CLINICAL DATA:  Cough, congestion. History of lung cancer. Followup measurable disease.  EXAM: CHEST  2 VIEW  COMPARISON:  09/08/2013  FINDINGS: Moderate left pleural effusion again noted, stable. Persistent left suprahilar soft tissue fullness is also stable. Stable nodularity at the right lung base has a stable appearance. No new visible disease by chest x-ray. Stable left apical pleural thickening. Heart is normal size.  IMPRESSION: No significant change since prior study.   Electronically Signed   By: Rolm Baptise M.D.   On: 01/06/2014 10:06    ASSESSMENT:80 y.o.  [place] woman,   (1)  status post left lower lobectomy in March 2005 for a 3.2 cm, grade 2 lung adenocarcinoma with multiple positive lymph nodes.   (2)  Treated adjuvantly with carboplatin, etoposide, and radiation, completed in August 2005.   (3)  In July 2006, she had multiple bilateral lung lesions documented to be increasing in size. At that time she was started on erlotinib. After a period of stable disease, the erlotinib (Tarceva) was stopped for a three-month period to see if stability persisted. She had definite disease growth during that period and the erlotinib was resumed. Continues now at 100 mg every other day with good tolerance and stable disease according to most recent chest xray on 01/06/2014.  PLAN:  [name] appears to be doing very well with regards to her breast cancer, and I am making no  changes to her current regimen. She will continue on erlotinib as before, 100 mg every other day. We'll continue to see her here every 4 months for repeat labs, chest x-ray, and physical exam. Her next visit here will be in October.   In the meanwhile, [name] has had some periodic irregularities with regards to both her hemoglobin and her alkaline phosphatase. I think these are likely to be chronic abnormalities, and overall they appear to be stable. The alkaline phosphatase, however, was the highest it has been today at 184, and I think the most prudent thing to do is to repeat these labs in approximately 4 weeks. [name] would like to have these drawn in [place] near her home, and she will have the results faxed to [place] in late June.  Accordingly, she was given a written order today. This is primarily to make sure there is no upper trend in the alkaline phosphatase which could indicate a need for further evaluation. I will mention that the remainder of her liver enzymes are all normal.   The above was reviewed in detail with both [name] and her son, [name], today. They both voice their understanding and agreement with the above plan. She knows as always to call with any changes or problems prior to her next appointment.   Thomasina Housley Milda Smart PA-C    01/06/2014

## 2014-03-10 ENCOUNTER — Encounter: Payer: Self-pay | Admitting: Oncology

## 2014-03-10 ENCOUNTER — Telehealth: Payer: Self-pay

## 2014-03-10 NOTE — Telephone Encounter (Signed)
Pt called and left message - she wanted Dr Jana Hakim to be aware of recent progress.  Pt reports seeing GM in June (she saw Micah Flesher 5/27).  Amy requested she have her labs done in 4 weeks, the patient had them done July 20.  Pt reports weakness in June and reports that iron did not help with it - can find no record in office notes reporting weakness or ordering iron.  The patient reports she went to Dr. Hoyle Sauer - [facility] family practice who ordered a CXR that showed she has fluid around her lung.  Dr. Hoyle Sauer put the patient on 10 days Levofloxacin and told pt if there was no improvement she would need to have a needle biopsy.  Information provided to GM.

## 2014-03-11 ENCOUNTER — Telehealth: Payer: Self-pay | Admitting: *Deleted

## 2014-03-11 NOTE — Telephone Encounter (Signed)
Pt called to this RN to state due to ongoing SOB and difficulty breathing with CXray showing fluid around lung - primary MD is recommending a needle biopsy.  Per Benjamine Mola she would prefer to have procedure down locally for convience of transportation and care.  This RN contacted Dr Meyer Russel office at [phone] and spoke with his med tech- Tito Dine.  She is in process of scheduling procedure and request any request for labs per biopsy to be faxed to her at [phone].  This note will be given to MD for review and orders.

## 2014-03-15 ENCOUNTER — Telehealth: Payer: Self-pay | Admitting: *Deleted

## 2014-03-16 ENCOUNTER — Telehealth: Payer: Self-pay | Admitting: *Deleted

## 2014-03-16 ENCOUNTER — Inpatient Hospital Stay (HOSPITAL_COMMUNITY)
Admission: AD | Admit: 2014-03-16 | Discharge: 2014-03-23 | DRG: 871 | Disposition: A | Discharge status: Skilled Nursing Facility | Payer: Medicare Other | Source: Other Acute Inpatient Hospital | Attending: Internal Medicine | Admitting: Internal Medicine

## 2014-03-16 ENCOUNTER — Encounter (HOSPITAL_COMMUNITY): Payer: Self-pay

## 2014-03-16 ENCOUNTER — Other Ambulatory Visit: Payer: Self-pay | Admitting: Oncology

## 2014-03-16 DIAGNOSIS — I129 Hypertensive chronic kidney disease with stage 1 through stage 4 chronic kidney disease, or unspecified chronic kidney disease: Secondary | ICD-10-CM | POA: Diagnosis present

## 2014-03-16 DIAGNOSIS — R945 Abnormal results of liver function studies: Secondary | ICD-10-CM | POA: Diagnosis present

## 2014-03-16 DIAGNOSIS — J189 Pneumonia, unspecified organism: Secondary | ICD-10-CM | POA: Diagnosis present

## 2014-03-16 DIAGNOSIS — D649 Anemia, unspecified: Secondary | ICD-10-CM | POA: Diagnosis present

## 2014-03-16 DIAGNOSIS — Z902 Acquired absence of lung [part of]: Secondary | ICD-10-CM | POA: Diagnosis not present

## 2014-03-16 DIAGNOSIS — N179 Acute kidney failure, unspecified: Secondary | ICD-10-CM | POA: Diagnosis present

## 2014-03-16 DIAGNOSIS — C3430 Malignant neoplasm of lower lobe, unspecified bronchus or lung: Secondary | ICD-10-CM | POA: Diagnosis present

## 2014-03-16 DIAGNOSIS — E8809 Other disorders of plasma-protein metabolism, not elsewhere classified: Secondary | ICD-10-CM | POA: Diagnosis present

## 2014-03-16 DIAGNOSIS — R6520 Severe sepsis without septic shock: Secondary | ICD-10-CM

## 2014-03-16 DIAGNOSIS — M7989 Other specified soft tissue disorders: Secondary | ICD-10-CM | POA: Diagnosis present

## 2014-03-16 DIAGNOSIS — I82B19 Acute embolism and thrombosis of unspecified subclavian vein: Secondary | ICD-10-CM | POA: Diagnosis present

## 2014-03-16 DIAGNOSIS — Z923 Personal history of irradiation: Secondary | ICD-10-CM

## 2014-03-16 DIAGNOSIS — E869 Volume depletion, unspecified: Secondary | ICD-10-CM | POA: Diagnosis present

## 2014-03-16 DIAGNOSIS — J90 Pleural effusion, not elsewhere classified: Secondary | ICD-10-CM | POA: Diagnosis present

## 2014-03-16 DIAGNOSIS — N189 Chronic kidney disease, unspecified: Secondary | ICD-10-CM | POA: Diagnosis present

## 2014-03-16 DIAGNOSIS — A419 Sepsis, unspecified organism: Secondary | ICD-10-CM | POA: Diagnosis present

## 2014-03-16 DIAGNOSIS — K7200 Acute and subacute hepatic failure without coma: Secondary | ICD-10-CM | POA: Diagnosis present

## 2014-03-16 DIAGNOSIS — W19XXXA Unspecified fall, initial encounter: Secondary | ICD-10-CM | POA: Diagnosis present

## 2014-03-16 DIAGNOSIS — E875 Hyperkalemia: Secondary | ICD-10-CM | POA: Diagnosis present

## 2014-03-16 DIAGNOSIS — I509 Heart failure, unspecified: Secondary | ICD-10-CM | POA: Diagnosis present

## 2014-03-16 DIAGNOSIS — E039 Hypothyroidism, unspecified: Secondary | ICD-10-CM | POA: Diagnosis present

## 2014-03-16 DIAGNOSIS — E8779 Other fluid overload: Secondary | ICD-10-CM

## 2014-03-16 DIAGNOSIS — R609 Edema, unspecified: Secondary | ICD-10-CM | POA: Diagnosis present

## 2014-03-16 DIAGNOSIS — R7989 Other specified abnormal findings of blood chemistry: Secondary | ICD-10-CM | POA: Diagnosis present

## 2014-03-16 DIAGNOSIS — R6521 Severe sepsis with septic shock: Secondary | ICD-10-CM

## 2014-03-16 DIAGNOSIS — K8020 Calculus of gallbladder without cholecystitis without obstruction: Secondary | ICD-10-CM | POA: Diagnosis present

## 2014-03-16 DIAGNOSIS — I10 Essential (primary) hypertension: Secondary | ICD-10-CM | POA: Diagnosis present

## 2014-03-16 DIAGNOSIS — R0602 Shortness of breath: Secondary | ICD-10-CM | POA: Diagnosis present

## 2014-03-16 DIAGNOSIS — E871 Hypo-osmolality and hyponatremia: Secondary | ICD-10-CM | POA: Diagnosis present

## 2014-03-16 DIAGNOSIS — R748 Abnormal levels of other serum enzymes: Secondary | ICD-10-CM

## 2014-03-16 DIAGNOSIS — E038 Other specified hypothyroidism: Secondary | ICD-10-CM

## 2014-03-16 DIAGNOSIS — C3432 Malignant neoplasm of lower lobe, left bronchus or lung: Secondary | ICD-10-CM | POA: Diagnosis present

## 2014-03-16 DIAGNOSIS — E8770 Fluid overload, unspecified: Secondary | ICD-10-CM

## 2014-03-16 MED ORDER — PNEUMOCOCCAL VAC POLYVALENT 25 MCG/0.5ML IJ INJ
0.5000 mL | INJECTION | INTRAMUSCULAR | Status: AC
  Administered 2014-03-17: 0.5 mL via INTRAMUSCULAR
  Filled 2014-03-16 (×2): qty 0.5

## 2014-03-16 NOTE — H&P (Addendum)
[facility] History and Physical  Patient: [name]  VQQ:[id number]  DOB: 1925/04/20  DOS: the patient was seen and examined on 03/16/2014 PCP: Robyn Haber, MD  Chief Complaint: Fall  HPI: TAMRE CASS is a 80 y.o. female with Past medical history of non-small cell lung cancer status post lobectomy in 2005 with adjuvant chemotherapy and radiation on Tarceva at present since 2006, hypothyroidism, hypertension, CHF. The patient is 80 year old female with past medical history of non-small cell lung cancer. She has chronic mild to moderate size pleural effusion on the left. She was seen here in May 15 at which time she had mild shortness of breath with exertion which was stable. Since last few weeks she has been having progressively worsening shortness of breath along with cough. She also had some sputum production. She was started on antibiotic levofloxacin in the end of July 15. A chest x-ray was performed which showed moderate pleural effusion and she had a thoracentesis with 1.1 L removed. After the procedure when she was at home she had a fall with hypotension per EMS. On further questioning she mentioned that she had similar falls when she stumbled on things twice in that week. She was initially seen at [place]. Initially she was admitted ICU. She was given multiple IV bolus normal saline. Initially a centerline and later on left-sided PICC line was placed. She was also initially given phenylephrine for her shock. A chest x-ray was performed which was showing reaccumulation of pleural fluid. Patient was covered with broad-spectrum antibiotics vancomycin and cefepime. Earlier today she was found to have worsening hyponatremia. Pulmonary consultation was done today recommended the patient to be transferred to [facility] by patient's primary oncologist is available and the transfer was made and patient physician's request. At the time  of my evaluation patient mentions that she has been feeling significantly better than her initial arrival on 03/12/2014 but she still continues to have cough with yellow-green expectoration, shortness of breath, significant fatigue and generalized weakness. She denies any chest pain fever chills nausea vomiting abdominal pain diarrhea constipation burning urination. She has noted some swelling of her leg but denies any leg tenderness.  Her daily labs were as follow on 03/16/14 Sodium 126 ( 124 on the day of admission) Creat 1.4 Cortisol 24 Magnesium 2 Uric acid 3.5 TSH 6.7, free T4 1.36 Urine osmolality 458 Urine sodium <20 Wbc 5.8, (6.2on the day of admission) Hb 9.4, (on the day of admission 11.7) Platelet 265 Bands 2% Ct head no acute abnormality  chest x-ray on 03/15/2014 : worsening pleural efusion and bilasilar atlectesis  The patient is coming from home. And at her baseline independent for most of her ADL.  Review of Systems: as mentioned in the history of present illness.  A Comprehensive review of the other systems is negative.  Past Medical History  Diagnosis Date  . lung ca dx'd 08/2003    chemo/xrt comp 2005; tarceva ongoing  . Hypertension   . Hypothyroidism   . Lung cancer, lower lobe 07/11/2011   History reviewed. No pertinent past surgical history. Social History:  reports that she has never smoked. She has never used smokeless tobacco. She reports that she does not drink alcohol or use illicit drugs.  Allergies  Allergen Reactions  . Keflex [Cephalexin] Rash  . Sulfa Antibiotics Nausea And Vomiting  . Other     Pt states antibiotics--unable to remember names of meds  . Paroxetine Hcl Anxiety  .  Ramipril [Ramipril] Rash and Cough  . Sertraline Anxiety    History reviewed. No pertinent family history.  Prior to Admission medications   Medication Sig Start Date End Date Taking? Authorizing Provider  amLODipine (NORVASC) 5 MG tablet Take 2.5 mg by mouth  Daily.  05/01/11  Yes Historical Provider, MD  aspirin 81 MG tablet Take 81 mg by mouth daily.     Yes Historical Provider, MD  Calcium Carbonate-Vitamin D (CALCIUM + D PO) Take 1 tablet by mouth daily.    Yes Historical Provider, MD  erlotinib (TARCEVA) 100 MG tablet Take 1 tablet (100 mg total) by mouth every other day. 10/27/13  Yes Deatra Robinson, MD  ferrous gluconate (FERGON) 324 MG tablet Take 324 mg by mouth daily with breakfast.   Yes Historical Provider, MD  furosemide (LASIX) 20 MG tablet Take 20 mg by mouth daily.  11/08/11  Yes Historical Provider, MD  ibuprofen (ADVIL,MOTRIN) 200 MG tablet Take 200 mg by mouth every 6 (six) hours as needed.   Yes Historical Provider, MD  levofloxacin (LEVAQUIN) 500 MG tablet Take 1 tablet by mouth daily. For 10 days 03/08/14  Yes Historical Provider, MD  levothyroxine (SYNTHROID, LEVOTHROID) 112 MCG tablet Take 112 mcg by mouth daily before breakfast.   Yes Historical Provider, MD  loratadine (CLARITIN) 10 MG tablet Take 10 mg by mouth daily.     Yes Historical Provider, MD  Magnesium 100 MG CAPS Take 1 capsule by mouth daily.   Yes Historical Provider, MD  meclizine (ANTIVERT) 12.5 MG tablet Take 1 tablet (12.5 mg total) by mouth 3 (three) times daily as needed for dizziness. 05/19/13  Yes Amy Milda Smart, PA-C  metroNIDAZOLE (METROCREAM) 0.75 % cream Apply 1 application topically 2 (two) times daily as needed (rosacea).  05/07/13  Yes Historical Provider, MD  polyethylene glycol (MIRALAX / GLYCOLAX) packet Take 17 g by mouth daily as needed for mild constipation.    Yes Historical Provider, MD  vitamin B-12 (CYANOCOBALAMIN) 1000 MCG tablet Take 1,000 mcg by mouth daily.     Yes Historical Provider, MD  vitamin C (ASCORBIC ACID) 500 MG tablet Take 500 mg by mouth daily.   Yes Historical Provider, MD  zolpidem (AMBIEN) 10 MG tablet Take 2.5-5 mg by mouth at bedtime as needed for sleep.   Yes Historical Provider, MD    Physical Exam: Filed Vitals:   03/16/14  2208  BP: 146/61  Pulse: 97  Temp: 98.6 F (37 C)  TempSrc: Oral  Resp: 24  SpO2: 100%    General: Alert, Awake and Oriented to Time, Place and Person. Appear in mild distress Eyes: PERRL ENT: Oral Mucosa clear moist. Neck: Difficult to assess JVD Cardiovascular: S1 and S2 Present, aortic systolic Murmur, Peripheral Pulses Present Respiratory: Bilateral Air entry equal and Decreased, bilateral Crackles, occasional wheezes Abdomen: Bowel Sound Present, Soft and Non tender Skin: No Rash Extremities: Left upper extremity swollen as compared to the right No tenderness on the left upper extremity Bilateral Pedal edema, no calf tenderness Neurologic: Grossly no focal neuro deficit other than generalized weakness  Labs on Admission:  CBC: No results found for this basename: WBC, NEUTROABS, HGB, HCT, MCV, PLT,  in the last 168 hours  CMP     Component Value Date/Time   NA 140 01/06/2014 0914   NA 137 11/08/2011 0924   NA 137 03/07/2011 0844   K 4.1 01/06/2014 0914   K 3.8 11/08/2011 0924   K 4.1 03/07/2011 0844  CL 103 11/10/2012 0939   CL 102 11/08/2011 0924   CL 102 03/07/2011 0844   CO2 24 01/06/2014 0914   CO2 26 11/08/2011 0924   CO2 28 03/07/2011 0844   GLUCOSE 99 01/06/2014 0914   GLUCOSE 74 11/10/2012 0939   GLUCOSE 90 11/08/2011 0924   GLUCOSE 101 03/07/2011 0844   BUN 14.5 01/06/2014 0914   BUN 22 11/08/2011 0924   BUN 15 03/07/2011 0844   CREATININE 1.0 01/06/2014 0914   CREATININE 1.34* 11/08/2011 0924   CREATININE 1.3* 03/07/2011 0844   CALCIUM 9.7 01/06/2014 0914   CALCIUM 9.6 11/08/2011 0924   CALCIUM 9.6 03/07/2011 0844   PROT 6.8 01/06/2014 0914   PROT 7.0 11/08/2011 0924   PROT 6.9 03/07/2011 0844   ALBUMIN 3.4* 01/06/2014 0914   ALBUMIN 3.5 11/08/2011 0924   AST 20 01/06/2014 0914   AST 19 11/08/2011 0924   AST 29 03/07/2011 0844   ALT 15 01/06/2014 0914   ALT 11 11/08/2011 0924   ALT 20 03/07/2011 0844   ALKPHOS 184* 01/06/2014 0914   ALKPHOS 166* 11/08/2011 0924   ALKPHOS  155* 03/07/2011 0844   BILITOT 0.41 01/06/2014 0914   BILITOT 0.3 11/08/2011 0924   BILITOT 0.60 03/07/2011 0844    No results found for this basename: LIPASE, AMYLASE,  in the last 168 hours No results found for this basename: AMMONIA,  in the last 168 hours  No results found for this basename: CKTOTAL, CKMB, CKMBINDEX, TROPONINI,  in the last 168 hours BNP (last 3 results)  Recent Labs  03/17/14 0105  PROBNP 2029.0*    Radiological Exams on Admission: No results found.  EKG: Independently reviewed. normal sinus rhythm, nonspecific ST and T waves changes. Assessment/Plan Principal Problem:   HCAP (healthcare-associated pneumonia) Active Problems:   Lung cancer, lower lobe   Hypothyroidism   Hypertension   Anemia, unspecified   Septic shock   Hyponatremia   Abnormal LFTs   Fluid overload   Left arm swelling   1. HCAP (healthcare-associated pneumonia) The patient is presenting with complaints of cough, shortness of breath, hypotension requiring pressors. She was initially admitted [facility] and now transferred here for further workup. Her chest x-ray has been reviewed and shows bilateral basal changes suspicious for pneumonia. She will be treated with continuation of her broad-spectrum antibiotic that she was receiving at the [facility]. I would continue her on IV vancomycin and cefepime. Sputum cultures and urine antigens will be checked.  2. Left lung non-small cell cancer Patient appears to have worsening effusion in the site of her prior lobectomy. Patient was initially recommended a CT chest at the other facility but she preferred to be seen by her oncologist before doing so. We will discuss with oncology in morning to followup with the patient for further workup. Cytology report is not available but she did have increased lymphocytes on her pleural effusion analysis, LDH was 111. I will provide her with incentive spirometry and flutter  valve for pulmonary toilet.  3. Hyponatremia Volume overload The patient appears to have significant volume overload with evidence of bilateral edema and hyponatremia. Currently have it all of her labs she may require resumption of her Lasix. She recently had received IV fluids resuscitation for her sepsis therefore I would recommend gentle on her diagnosis. Monitor ins and outs and daily weight  4. Abnormal LFT Patient does not have any abdominal tenderness Which is right upper quadrant ultrasound  5. Left upper  extremity swelling Patient has a PICC line placement in the same extremity with ultrasound  6. Hypothyroidism Continue Synthroid  DVT Prophylaxis: subcutaneous Heparin Nutrition: Cardiac diet  Code Status: Full, she wants [place] to discuss with her son in the morning until that she wants to remain full code  Family Communication: Patient requesting to discuss with her son only in the morning  Disposition: Admitted to inpatient in telemetry unit.  Author: Berle Mull, MD [facility] Pager: [phone] 03/16/2014, 11:49 PM    If 7PM-7AM, please contact night-coverage [email or web address] Password [email or web address]  **Disclaimer: This note may have been dictated with voice recognition software. Similar sounding words can inadvertently be transcribed and this note may contain transcription errors which may not have been corrected upon publication of note.**

## 2014-03-16 NOTE — Telephone Encounter (Signed)
This RN received message as well as communication per after hours nurse from pt's son stating possible need to transfer care from hospital in [place] to [place].  Return call number for Lewis given as [phone].  This RN returned call to Bobby Rumpf- he states hospitalist at [facility] is discussing transferring pt Bobby Rumpf would prefer for pt to come to [facility] and be under the care of Dr Jannifer Rodney. Doyle is presently in room 329 bed 1 at above hospital.  This RN called to [facility] at [phone] and spoke with pt''s attending nurse- Santa Genera.  Per phone discussion she states the medical team is preparing to meet in the next few minutes regarding pt and possible transfer to either [facility] or [place].  This RN gave Santa Genera Dr Gerarda Fraction cell number for direct communication with MD.  This RN's direct number given as well for any additional nursing issues.

## 2014-03-17 ENCOUNTER — Inpatient Hospital Stay (HOSPITAL_COMMUNITY): Payer: Medicare Other

## 2014-03-17 DIAGNOSIS — E871 Hypo-osmolality and hyponatremia: Secondary | ICD-10-CM | POA: Diagnosis present

## 2014-03-17 DIAGNOSIS — E8770 Fluid overload, unspecified: Secondary | ICD-10-CM | POA: Diagnosis present

## 2014-03-17 DIAGNOSIS — J90 Pleural effusion, not elsewhere classified: Secondary | ICD-10-CM

## 2014-03-17 DIAGNOSIS — M7989 Other specified soft tissue disorders: Secondary | ICD-10-CM

## 2014-03-17 DIAGNOSIS — Z85118 Personal history of other malignant neoplasm of bronchus and lung: Secondary | ICD-10-CM

## 2014-03-17 DIAGNOSIS — A419 Sepsis, unspecified organism: Secondary | ICD-10-CM | POA: Diagnosis present

## 2014-03-17 DIAGNOSIS — R945 Abnormal results of liver function studies: Secondary | ICD-10-CM | POA: Diagnosis present

## 2014-03-17 DIAGNOSIS — R7989 Other specified abnormal findings of blood chemistry: Secondary | ICD-10-CM | POA: Diagnosis present

## 2014-03-17 DIAGNOSIS — R6521 Severe sepsis with septic shock: Secondary | ICD-10-CM

## 2014-03-17 DIAGNOSIS — J189 Pneumonia, unspecified organism: Secondary | ICD-10-CM

## 2014-03-17 DIAGNOSIS — R748 Abnormal levels of other serum enzymes: Secondary | ICD-10-CM

## 2014-03-17 LAB — CBC WITH DIFFERENTIAL/PLATELET
BASOS ABS: 0 10*3/uL (ref 0.0–0.1)
Basophils Relative: 0 % (ref 0–1)
EOS ABS: 0.2 10*3/uL (ref 0.0–0.7)
Eosinophils Relative: 3 % (ref 0–5)
HEMATOCRIT: 27.6 % — AB (ref 36.0–46.0)
Hemoglobin: 9.3 g/dL — ABNORMAL LOW (ref 12.0–15.0)
LYMPHS PCT: 5 % — AB (ref 12–46)
Lymphs Abs: 0.3 10*3/uL — ABNORMAL LOW (ref 0.7–4.0)
MCH: 27.8 pg (ref 26.0–34.0)
MCHC: 33.7 g/dL (ref 30.0–36.0)
MCV: 82.6 fL (ref 78.0–100.0)
MONO ABS: 0.7 10*3/uL (ref 0.1–1.0)
Monocytes Relative: 13 % — ABNORMAL HIGH (ref 3–12)
Neutro Abs: 4.2 10*3/uL (ref 1.7–7.7)
Neutrophils Relative %: 79 % — ABNORMAL HIGH (ref 43–77)
Platelets: 230 10*3/uL (ref 150–400)
RBC: 3.34 MIL/uL — ABNORMAL LOW (ref 3.87–5.11)
RDW: 17.7 % — ABNORMAL HIGH (ref 11.5–15.5)
WBC: 5.3 10*3/uL (ref 4.0–10.5)

## 2014-03-17 LAB — COMPREHENSIVE METABOLIC PANEL
ALK PHOS: 213 U/L — AB (ref 39–117)
ALT: 148 U/L — ABNORMAL HIGH (ref 0–35)
ANION GAP: 10 (ref 5–15)
AST: 189 U/L — ABNORMAL HIGH (ref 0–37)
Albumin: 2.1 g/dL — ABNORMAL LOW (ref 3.5–5.2)
BUN: 31 mg/dL — AB (ref 6–23)
CO2: 20 meq/L (ref 19–32)
Calcium: 8.9 mg/dL (ref 8.4–10.5)
Chloride: 96 mEq/L (ref 96–112)
Creatinine, Ser: 1.32 mg/dL — ABNORMAL HIGH (ref 0.50–1.10)
GFR calc non Af Amer: 35 mL/min — ABNORMAL LOW (ref >90)
GFR, EST AFRICAN AMERICAN: 41 mL/min — AB (ref >90)
GLUCOSE: 113 mg/dL — AB (ref 70–99)
POTASSIUM: 5.3 meq/L (ref 3.7–5.3)
SODIUM: 126 meq/L — AB (ref 137–147)
Total Bilirubin: 0.5 mg/dL (ref 0.3–1.2)
Total Protein: 5.4 g/dL — ABNORMAL LOW (ref 6.0–8.3)

## 2014-03-17 LAB — PROTIME-INR
INR: 1.15 (ref 0.00–1.49)
Prothrombin Time: 14.7 seconds (ref 11.6–15.2)

## 2014-03-17 LAB — HEPARIN LEVEL (UNFRACTIONATED): Heparin Unfractionated: 0.66 IU/mL (ref 0.30–0.70)

## 2014-03-17 LAB — STREP PNEUMONIAE URINARY ANTIGEN: Strep Pneumo Urinary Antigen: NEGATIVE

## 2014-03-17 LAB — BASIC METABOLIC PANEL
Anion gap: 10 (ref 5–15)
BUN: 31 mg/dL — ABNORMAL HIGH (ref 6–23)
CALCIUM: 9.4 mg/dL (ref 8.4–10.5)
CO2: 23 meq/L (ref 19–32)
Chloride: 96 mEq/L (ref 96–112)
Creatinine, Ser: 1.4 mg/dL — ABNORMAL HIGH (ref 0.50–1.10)
GFR calc Af Amer: 38 mL/min — ABNORMAL LOW (ref >90)
GFR, EST NON AFRICAN AMERICAN: 33 mL/min — AB (ref >90)
GLUCOSE: 115 mg/dL — AB (ref 70–99)
Potassium: 5.3 mEq/L (ref 3.7–5.3)
Sodium: 129 mEq/L — ABNORMAL LOW (ref 137–147)

## 2014-03-17 LAB — APTT: aPTT: 36 seconds (ref 24–37)

## 2014-03-17 LAB — VANCOMYCIN, TROUGH: Vancomycin Tr: 21.1 ug/mL — ABNORMAL HIGH (ref 10.0–20.0)

## 2014-03-17 LAB — MAGNESIUM: Magnesium: 2.1 mg/dL (ref 1.5–2.5)

## 2014-03-17 LAB — PHOSPHORUS: Phosphorus: 2.5 mg/dL (ref 2.3–4.6)

## 2014-03-17 LAB — PRO B NATRIURETIC PEPTIDE: Pro B Natriuretic peptide (BNP): 2029 pg/mL — ABNORMAL HIGH (ref 0–450)

## 2014-03-17 MED ORDER — FUROSEMIDE 10 MG/ML IJ SOLN
20.0000 mg | Freq: Once | INTRAMUSCULAR | Status: AC
  Administered 2014-03-17: 20 mg via INTRAVENOUS

## 2014-03-17 MED ORDER — DEXTROSE 5 % IV SOLN
1.0000 g | INTRAVENOUS | Status: DC
  Administered 2014-03-17 – 2014-03-23 (×7): 1 g via INTRAVENOUS
  Filled 2014-03-17 (×7): qty 1

## 2014-03-17 MED ORDER — FUROSEMIDE 10 MG/ML IJ SOLN
INTRAMUSCULAR | Status: AC
  Filled 2014-03-17: qty 2

## 2014-03-17 MED ORDER — HEPARIN (PORCINE) IN NACL 100-0.45 UNIT/ML-% IJ SOLN
1200.0000 [IU]/h | INTRAMUSCULAR | Status: AC
  Administered 2014-03-17 – 2014-03-19 (×3): 1200 [IU]/h via INTRAVENOUS
  Filled 2014-03-17 (×4): qty 250

## 2014-03-17 MED ORDER — IPRATROPIUM-ALBUTEROL 0.5-2.5 (3) MG/3ML IN SOLN
3.0000 mL | RESPIRATORY_TRACT | Status: DC
  Administered 2014-03-17 – 2014-03-18 (×6): 3 mL via RESPIRATORY_TRACT
  Filled 2014-03-17 (×7): qty 3

## 2014-03-17 MED ORDER — VANCOMYCIN HCL IN DEXTROSE 1-5 GM/200ML-% IV SOLN
1000.0000 mg | INTRAVENOUS | Status: DC
  Administered 2014-03-17: 1000 mg via INTRAVENOUS
  Filled 2014-03-17: qty 200

## 2014-03-17 MED ORDER — POLYETHYLENE GLYCOL 3350 17 G PO PACK
17.0000 g | PACK | Freq: Every day | ORAL | Status: DC | PRN
  Filled 2014-03-17: qty 1

## 2014-03-17 MED ORDER — MECLIZINE HCL 12.5 MG PO TABS
12.5000 mg | ORAL_TABLET | Freq: Three times a day (TID) | ORAL | Status: DC | PRN
  Filled 2014-03-17: qty 1

## 2014-03-17 MED ORDER — AMLODIPINE BESYLATE 2.5 MG PO TABS
2.5000 mg | ORAL_TABLET | Freq: Every day | ORAL | Status: DC
  Administered 2014-03-17 – 2014-03-20 (×4): 2.5 mg via ORAL
  Filled 2014-03-17 (×4): qty 1

## 2014-03-17 MED ORDER — IOHEXOL 350 MG/ML SOLN
100.0000 mL | Freq: Once | INTRAVENOUS | Status: AC | PRN
  Administered 2014-03-17: 100 mL via INTRAVENOUS

## 2014-03-17 MED ORDER — VANCOMYCIN HCL IN DEXTROSE 750-5 MG/150ML-% IV SOLN
750.0000 mg | INTRAVENOUS | Status: DC
  Administered 2014-03-18 – 2014-03-20 (×2): 750 mg via INTRAVENOUS
  Filled 2014-03-17 (×4): qty 150

## 2014-03-17 MED ORDER — GUAIFENESIN ER 600 MG PO TB12
600.0000 mg | ORAL_TABLET | Freq: Two times a day (BID) | ORAL | Status: DC
  Administered 2014-03-17 – 2014-03-23 (×13): 600 mg via ORAL
  Filled 2014-03-17 (×15): qty 1

## 2014-03-17 MED ORDER — HEPARIN SODIUM (PORCINE) 5000 UNIT/ML IJ SOLN
5000.0000 [IU] | Freq: Three times a day (TID) | INTRAMUSCULAR | Status: DC
  Administered 2014-03-17: 5000 [IU] via SUBCUTANEOUS
  Filled 2014-03-17 (×4): qty 1

## 2014-03-17 MED ORDER — ZOLPIDEM TARTRATE 5 MG PO TABS: 5.0000 mg | ORAL_TABLET | Freq: Every evening | ORAL | Status: DC | PRN

## 2014-03-17 MED ORDER — DEXTROSE 5 % IV SOLN
1.0000 g | Freq: Three times a day (TID) | INTRAVENOUS | Status: DC
  Filled 2014-03-17: qty 1

## 2014-03-17 MED ORDER — ERLOTINIB HCL 100 MG PO TABS: 100.0000 mg | ORAL_TABLET | ORAL | Status: DC

## 2014-03-17 MED ORDER — HEPARIN BOLUS VIA INFUSION
4000.0000 [IU] | Freq: Once | INTRAVENOUS | Status: AC
  Administered 2014-03-17: 4000 [IU] via INTRAVENOUS
  Filled 2014-03-17: qty 4000

## 2014-03-17 MED ORDER — ASPIRIN 81 MG PO CHEW
81.0000 mg | CHEWABLE_TABLET | Freq: Every day | ORAL | Status: DC
  Administered 2014-03-17 – 2014-03-23 (×7): 81 mg via ORAL
  Filled 2014-03-17 (×7): qty 1

## 2014-03-17 MED ORDER — CEFEPIME HCL 1 G IJ SOLR
1.0000 g | INTRAMUSCULAR | Status: DC
  Filled 2014-03-17: qty 1

## 2014-03-17 MED ORDER — LEVOTHYROXINE SODIUM 112 MCG PO TABS
112.0000 ug | ORAL_TABLET | Freq: Every day | ORAL | Status: DC
  Administered 2014-03-17 – 2014-03-21 (×5): 112 ug via ORAL
  Filled 2014-03-17 (×6): qty 1

## 2014-03-17 NOTE — Progress Notes (Signed)
ANTIBIOTIC CONSULT NOTE - INITIAL  Pharmacy Consult for Cefepime and Vancomycin  Indication: pneumonia  Allergies  Allergen Reactions  . Keflex [Cephalexin] Rash  . Sulfa Antibiotics Nausea And Vomiting  . Other     Pt states antibiotics--unable to remember names of meds  . Paroxetine Hcl Anxiety  . Ramipril [Ramipril] Rash and Cough  . Sertraline Anxiety    Patient Measurements: Height: 5\' 5"  (165.1 cm) Weight: 159 lb 3.2 oz (72.213 kg) IBW/kg (Calculated) : 57   Vital Signs: Temp: 98.1 F (36.7 C) (08/05 0518) Temp src: Oral (08/05 0518) BP: 125/60 mmHg (08/05 0518) Pulse Rate: 85 (08/05 0518) Intake/Output from previous day: 08/04 0701 - 08/05 0700 In: -  Out: 100 [Urine:100] Intake/Output from this shift: Total I/O In: -  Out: 100 [Urine:100]  Labs:  Recent Labs  03/17/14 0110 03/17/14 0315  WBC 5.3  --   HGB 9.3*  --   PLT 230  --   CREATININE  --  1.32*   Estimated Creatinine Clearance: 30.5 ml/min (by C-G formula based on Cr of 1.32). No results found for this basename: VANCOTROUGH, VANCOPEAK, VANCORANDOM, GENTTROUGH, GENTPEAK, GENTRANDOM, TOBRATROUGH, TOBRAPEAK, TOBRARND, AMIKACINPEAK, AMIKACINTROU, AMIKACIN,  in the last 72 hours   Microbiology: No results found for this or any previous visit (from the past 720 hour(s)).  Medical History: Past Medical History  Diagnosis Date  . lung ca dx'd 08/2003    chemo/xrt comp 2005; tarceva ongoing  . Hypertension   . Hypothyroidism   . Lung cancer, lower lobe 07/11/2011    Medications:  Scheduled:  . amLODipine  2.5 mg Oral Daily  . aspirin  81 mg Oral Daily  . ceFEPime (MAXIPIME) IV  1 g Intravenous Q24H  . ceFEPime (MAXIPIME) IV  1 g Intravenous Q24H  . furosemide  20 mg Intravenous Once  . guaiFENesin  600 mg Oral BID  . heparin  5,000 Units Subcutaneous 3 times per day  . ipratropium-albuterol  3 mL Nebulization Q4H  . levothyroxine  112 mcg Oral QAC breakfast  . pneumococcal 23 valent  vaccine  0.5 mL Intramuscular Tomorrow-1000  . vancomycin  1,000 mg Intravenous Q24H   Infusions:   Assessment: 35 yoF with hx non-small cell Lung Ca on Tarceva, hypothyroidism, HTN and CHF.  Transferred from another hospital 8/5.  Pt was started on Cefepime and Vancomycin there not sure what day. MD continuing Vancomycin and Cefepime per Rx for PNA.   Goal of Therapy:  Vancomycin trough level 15-20 mcg/ml  Plan:   Cefepime 1Gm IV q24h  Vancomycin 1gm IV q24h  F/U SCR/cultures/levels  Lawana Pai R 03/17/2014,5:33 AM

## 2014-03-17 NOTE — Progress Notes (Signed)
ANTICOAGULATION CONSULT NOTE - Initial Consult  Pharmacy Consult for Heparin  Indication: DVT  Allergies  Allergen Reactions  . Keflex [Cephalexin] Rash  . Sulfa Antibiotics Nausea And Vomiting  . Other     Pt states antibiotics--unable to remember names of meds  . Paroxetine Hcl Anxiety  . Ramipril [Ramipril] Rash and Cough  . Sertraline Anxiety    Patient Measurements: Height: 5\' 5"  (165.1 cm) Weight: 159 lb 3.2 oz (72.213 kg) IBW/kg (Calculated) : 57 Heparin Dosing Weight: 72kg  Vital Signs: Temp: 98.1 F (36.7 C) (08/05 0518) Temp src: Oral (08/05 0518) BP: 125/60 mmHg (08/05 0518) Pulse Rate: 85 (08/05 0518)  Labs:  Recent Labs  03/17/14 0110 03/17/14 0315 03/17/14 0900  HGB 9.3*  --   --   HCT 27.6*  --   --   PLT 230  --   --   APTT 36  --   --   LABPROT 14.7  --   --   INR 1.15  --   --   CREATININE  --  1.32* 1.40*    Estimated Creatinine Clearance: 28.7 ml/min (by C-G formula based on Cr of 1.4).   Medical History: Past Medical History  Diagnosis Date  . lung ca dx'd 08/2003    chemo/xrt comp 2005; tarceva ongoing  . Hypertension   . Hypothyroidism   . Lung cancer, lower lobe 07/11/2011    Assessment: 69 yof with PMH of NSCLC s/p lobectomy in 2005 with adjuvent chemo and radiation on Tarceva since 2006, hypothyroidism, HTN, CHF.  Venus duplex revealed left upper extremity DVT. Pharmacy to start IV heparin.  Goal of Therapy:  Heparin level 0.3-0.7 units/ml Monitor platelets by anticoagulation protocol: Yes   Plan:  Give 4000 units bolus x 1 Start heparin infusion at 1200 units/hr Continue to monitor H&H and platelets Check heparin level 8 hours after start of infusion  Kizzie Furnish, PharmD Pager: [phone] 03/17/2014 10:41 AM

## 2014-03-17 NOTE — Progress Notes (Signed)
[facility] PROGRESS NOTE   [name] QTM:[id number] DOB: Sep 17, 1924 DOA: 03/16/2014 PCP: Robyn Haber, MD  HPI/Subjective: Patient feels much better, seen with son at bedside.  Assessment/Plan: Principal Problem:   HCAP (healthcare-associated pneumonia) Active Problems:   Lung cancer, lower lobe   Hypothyroidism   Hypertension   Anemia, unspecified   Septic shock   Hyponatremia   Abnormal LFTs   Fluid overload   Left arm swelling    HCAP (healthcare-associated pneumonia)  -The patient is presenting with complaints of cough, shortness of breath, hypotension requiring pressors.  -She was initially admitted at [facility] and now transferred here for further workup.  -Her chest x-ray has been reviewed and shows bilateral basal changes suspicious for pneumonia.  -She will be treated with continuation of her broad-spectrum antibiotic that she was receiving at the [facility].  -I would continue her on IV vancomycin and cefepime.   Acute DVT, involving left upper extremity -Patient presented with left upper extremity swelling, patient has PICC line in the same extremity. -Doppler ultrasound showed acute DVT in the subclavian vein. -Patient started on heparin drip.  Left lung non-small cell cancer  -Patient appears to have worsening effusion in the site of her prior lobectomy.  -Cytology report is not available but she did have increased lymphocytes on her pleural effusion analysis, LDH was 111.  -I will provide her with incentive spirometry and flutter valve for pulmonary toilet.   Left sided pleural effusion -Status post recent thoracentesis and removal of about 1 L of fluids per son. -Increased lymphocytes, LDH was 111 but no cytology. Await oncology recommendations.  Hyponatremia  -Volume overload with third spacing secondary to hypoalbuminemia -The patient appears to have significant volume overload with evidence of bilateral  edema and hyponatremia.  -Currently have it all of her labs she may require resumption of her Lasix.  -She recently had received IV fluids resuscitation for her sepsis therefore I would recommend gentle on her diagnosis.  Monitor ins and outs and daily weight   Abnormal LFT  -Patient does not have any abdominal tenderness  -RUQ ultrasound showed no acute abnormalities apart from cholelithiasis, no Murphy's sign. -Patient probably did have a degree of shocked liver and she was admitted for hypotension which required vasopressors.  Hypothyroidism  -Continue Synthroid   Code Status: Full code Family Communication: Plan discussed with the patient. Disposition Plan: Remains inpatient   Consultants:  Oncology  Procedures:  None  Antibiotics:  Vancomycin and cefepime   Objective: Filed Vitals:   03/17/14 0518  BP: 125/60  Pulse: 85  Temp: 98.1 F (36.7 C)  Resp: 20    Intake/Output Summary (Last 24 hours) at 03/17/14 1309 Last data filed at 03/17/14 6256  Gross per 24 hour  Intake      0 ml  Output    350 ml  Net   -350 ml   Filed Weights   03/17/14 0336  Weight: 72.213 kg (159 lb 3.2 oz)    Exam: General: Alert and awake, oriented x3, not in any acute distress. HEENT: anicteric sclera, pupils reactive to light and accommodation, EOMI CVS: S1-S2 clear, no murmur rubs or gallops Chest: clear to auscultation bilaterally, no wheezing, rales or rhonchi Abdomen: soft nontender, nondistended, normal bowel sounds, no organomegaly Extremities: no cyanosis, clubbing or edema noted bilaterally Neuro: Cranial nerves II-XII intact, no focal neurological deficits  Data Reviewed: Basic Metabolic Panel:  Recent Labs Lab 03/17/14 0315 03/17/14 0900  NA 126* 129*  K 5.3 5.3  CL 96 96  CO2 20 23  GLUCOSE 113* 115*  BUN 31* 31*  CREATININE 1.32* 1.40*  CALCIUM 8.9 9.4  MG 2.1  --   PHOS 2.5  --    Liver Function Tests:  Recent Labs Lab 2014/03/18 0315  AST  189*  ALT 148*  ALKPHOS 213*  BILITOT 0.5  PROT 5.4*  ALBUMIN 2.1*   No results found for this basename: LIPASE, AMYLASE,  in the last 168 hours No results found for this basename: AMMONIA,  in the last 168 hours CBC:  Recent Labs Lab March 18, 2014 0110  WBC 5.3  NEUTROABS 4.2  HGB 9.3*  HCT 27.6*  MCV 82.6  PLT 230   Cardiac Enzymes: No results found for this basename: CKTOTAL, CKMB, CKMBINDEX, TROPONINI,  in the last 168 hours BNP (last 3 results)  Recent Labs  2014/03/18 0105  PROBNP 2029.0*   CBG: No results found for this basename: GLUCAP,  in the last 168 hours  Micro No results found for this or any previous visit (from the past 240 hour(s)).   Studies: US Abdomen Limited Ruq  03-18-2014   CLINICAL DATA:  Elevated liver function tests  EXAM: US ABDOMEN LIMITED - RIGHT UPPER QUADRANT  COMPARISON:  None.  FINDINGS: Gallbladder:  Multiple gallstones. The largest is 7 mm. No wall thickening. Negative Murphy sign.  Common bile duct:  Diameter: 4.2 mm in caliber.  Liver:  1.1 x 1.1 x 1.1 cm homogeneous hyperechoic lesion in the lateral segment of the left lobe of the liver. Otherwise, there is diffuse homogeneous echogenicity within the liver.  Additional findings:  Right pleural effusion.  IMPRESSION: Cholelithiasis.  1.1 cm hyperechoic lesion in the lateral segment of the left lobe of the liver. This is most likely benign. Followup ultrasound in 6 months is recommended to ensure stability. If the patient has high risk of malignancy or has a history of malignancy, MRI is recommended.  Right pleural effusion.   Electronically Signed   By: Maryclare Bean M.D.   On: 2014-03-18 08:41    Scheduled Meds: . amLODipine  2.5 mg Oral Daily  . aspirin  81 mg Oral Daily  . ceFEPime (MAXIPIME) IV  1 g Intravenous Q24H  . furosemide      . guaiFENesin  600 mg Oral BID  . ipratropium-albuterol  3 mL Nebulization Q4H  . levothyroxine  112 mcg Oral QAC breakfast  . [START ON 03/18/2014] vancomycin   750 mg Intravenous Q24H   Continuous Infusions: . heparin 1,200 Units/hr (03-18-14 1211)       Time spent: 35 minutes    [facility]  [facility] Pager [phone] If 7PM-7AM, please contact night-coverage at [email or web address], password [facility] 03/18/14, 1:09 PM  LOS: 1 day

## 2014-03-17 NOTE — Progress Notes (Signed)
ANTIBIOTIC CONSULT NOTE - Follow-up  Pharmacy Consult for Cefepime and Vancomycin  Indication: pneumonia  Allergies  Allergen Reactions  . Keflex [Cephalexin] Rash  . Sulfa Antibiotics Nausea And Vomiting  . Other     Pt states antibiotics--unable to remember names of meds  . Paroxetine Hcl Anxiety  . Ramipril [Ramipril] Rash and Cough  . Sertraline Anxiety    Patient Measurements: Height: 5\' 5"  (165.1 cm) Weight: 159 lb 3.2 oz (72.213 kg) IBW/kg (Calculated) : 57   Vital Signs: Temp: 98.1 F (36.7 C) (08/05 0518) Temp src: Oral (08/05 0518) BP: 125/60 mmHg (08/05 0518) Pulse Rate: 85 (08/05 0518) Intake/Output from previous day: 08/04 0701 - 08/05 0700 In: -  Out: 350 [Urine:350] Intake/Output from this shift:    Labs:  Recent Labs  03/17/14 0110 03/17/14 0315 03/17/14 0900  WBC 5.3  --   --   HGB 9.3*  --   --   PLT 230  --   --   CREATININE  --  1.32* 1.40*   Estimated Creatinine Clearance: 28.7 ml/min (by C-G formula based on Cr of 1.4).  Recent Labs  03/17/14 1135  VANCOTROUGH 21.1*     Microbiology: No results found for this or any previous visit (from the past 720 hour(s)).  Medical History: Past Medical History  Diagnosis Date  . lung ca dx'd 08/2003    chemo/xrt comp 2005; tarceva ongoing  . Hypertension   . Hypothyroidism   . Lung cancer, lower lobe 07/11/2011    Medications:  Scheduled:  . amLODipine  2.5 mg Oral Daily  . aspirin  81 mg Oral Daily  . ceFEPime (MAXIPIME) IV  1 g Intravenous Q24H  . furosemide      . guaiFENesin  600 mg Oral BID  . ipratropium-albuterol  3 mL Nebulization Q4H  . levothyroxine  112 mcg Oral QAC breakfast  . vancomycin  1,000 mg Intravenous Q24H   Infusions:  . heparin 1,200 Units/hr (03/17/14 1211)   Assessment: 21 yoF with hx non-small cell Lung Ca on Tarceva, hypothyroidism, HTN and CHF.  Transferred from another hospital 8/5.  Pt was started on Cefepime and Vancomycin there not sure what  day. MD continuing Vancomycin and Cefepime per Rx for PNA.   8/4 >>cefepime >> 8/4 >>vancomycin >>   Tmax: afebrile WBCs: 5.3 Renal: SCr 1.32 CrCl 37ml/min (N)  Drug levels: 8/5 1130 Vanc Trough = 21.1 on Vanc 1g Q24H (SUPRAtherapeutic)  Goal of Therapy:  Vancomycin trough level 15-20 mcg/ml  Plan:   Decrease Vancomycin to 750mg  IV Q24H  Continue Cefepime 1Gm IV q24h  F/U SCR/cultures/levels  Kizzie Furnish, PharmD Pager: [phone] 03/17/2014 12:37 PM

## 2014-03-17 NOTE — Progress Notes (Signed)
VASCULAR LAB PRELIMINARY  PRELIMINARY  PRELIMINARY  PRELIMINARY  Left upper extremity venous duplex completed.    Preliminary report:  Positive for a left upper extremity deep vein acute thrombus in the subclavian vein at the mid clavicular region. Also noted in a superficial thrombus along the walls of the PICC line from the insertion site to the axilla.  Odette Watanabe, RVS 03/17/2014, 9:44 AM

## 2014-03-17 NOTE — Progress Notes (Signed)
OT Cancellation Note  Patient Details Name: DILLYN JOAQUIN MRN: [id number] DOB: [birth year]   Cancelled Treatment:    Reason Eval/Treat Not Completed: Medical issues which prohibited therapy - pt with Lt. UE DVT and just starting Heparin  Darlina Rumpf [place], OTR/L [phone]  03/17/2014, 1:16 PM

## 2014-03-17 NOTE — Progress Notes (Signed)
PT Cancellation Note  Patient Details Name: [name] MRN: [id number] DOB: 10-09-1924   Cancelled Treatment:    Reason Eval/Treat Not Completed: Medical issues which prohibited therapy (acute L UE DVT and just started on heparin)   Audree Schrecengost,KATHrine E 03/17/2014, 12:32 PM

## 2014-03-17 NOTE — Consult Note (Signed)
[place]  [phone] Fax:([phone]) [phone]     ID: [name] DOB: 1925-03-03  MR#: [id number]  EQA#:[id number]  Patient Care Team: Gina Haber, MD as PCP - General (Family Medicine) PCP: Gina Haber, MD GYN: SU:  OTHER MD:  CHIEF COMPLAINT: LEFT SIDED LUNG CANCER  CURRENT TREATMENT: TARCEVA   HISTORY OF PRESENT ILLNESS: [name] had a total right hip under [name] in January 2005. As part of the preoperative work-up, she had a chest x-ray which showed a left lower lobe nodule. CT of the chest was obtained [name] Long on 08-12-03) and this was a 3.1 cm. left lower lobe mass concerning for a primary bronchogenic carcinoma. There was also precarinal and right hilar lymphadenopathy. The patient was referred to [name] for further evaluation and, after appropriate discussion, he set her up for a PET scan and appointment with myself and [name]. He also set her up for a fine needle aspiration of the left lung mass. This was performed on 09-24-03 and showed non-small cell lung carcinoma.   The patient's subsequent treatments are as detailed below.  INTERVAL HISTORY: The patient presented with cough and yellow phlegm and was treated by her PCP with oral antibiotics x 4 days. Her condition however deteriorated, she fell x2, and was taken to the ED in [place] where she was found to have a  REVIEW OF SYSTEMS:  PAST MEDICAL HISTORY: Past Medical History  Diagnosis Date  . lung ca dx'd 08/2003    chemo/xrt comp 11-04-2003; tarceva ongoing  . Hypertension   . Hypothyroidism   . Lung cancer, lower lobe 07/11/2011  Significant for osteoarthritis, history of depression, history of hypertension, history of hypothyroidism, history of remote right breast biopsy (benign), status post total right hip replacement in January of this year with very good results so far, status post hysterectomy and bilateral salpingo-oophorectomy, status  post appendectomy, status post remote exploratory laparotomy for small bowel obstruction, status post tonsillectomy and adenoidectomy, and status post benign skin biopsy.    PAST SURGICAL HISTORY: History reviewed. No pertinent past surgical history.  FAMILY HISTORY History reviewed. No pertinent family history. The patient's father died from lung cancer at the age of 64. He lived six months after diagnosis. He was a heavy smoker. The patient's mother died at 99-1/2 years. The patient has one brother who is on hemodialysis, apparently for nephrotic syndrome.  GYNECOLOGIC HISTORY:  No LMP recorded. Patient is postmenopausal. She is P1. She had her hysterectomy in 11-03-1972. She took hormones from that time until 11-04-2002  SOCIAL HISTORY:  She was an Optometrist. Her husband died in 11-04-94, apparently a cardiac death. The patient lives by herself with her "yorkie" dog. Her son is [name], who works as a Government social research officer for [employer], a Solicitor. [name], Rip Harbour, is a Journalist, newspaper. [name] and Rip Harbour have three sons. [name] is a presbyterian.     ADVANCED DIRECTIVES: In place. The patient's son is her health care POA. This admission (on 03/17/2014) the patient made it clear she would not want resuscitation in case of a terminal event. Her son, daughter in law and grandson were present during this discussion   HEALTH MAINTENANCE: History  Substance Use Topics  . Smoking status: Never Smoker   . Smokeless tobacco: Never Used  . Alcohol Use: No     Colonoscopy:  PAP:  Bone density:  Lipid panel:  Allergies  Allergen Reactions  . Keflex [Cephalexin] Rash  .  Sulfa Antibiotics Nausea And Vomiting  . Other     Pt states antibiotics--unable to remember names of meds  . Paroxetine Hcl Anxiety  . Ramipril [Ramipril] Rash and Cough  . Sertraline Anxiety    Current Facility-Administered Medications  Medication Dose Route Frequency Provider Last Rate Last Dose  . amLODipine  (NORVASC) tablet 2.5 mg  2.5 mg Oral Daily Gina Mull, MD   2.5 mg at 03/17/14 1050  . aspirin chewable tablet 81 mg  81 mg Oral Daily Gina Mull, MD   81 mg at 03/17/14 1050  . ceFEPIme (MAXIPIME) 1 g in dextrose 5 % 50 mL IVPB  1 g Intravenous Q24H Dorrene German, RPH   1 g at 03/17/14 1050  . furosemide (LASIX) 10 MG/ML injection           . guaiFENesin (MUCINEX) 12 hr tablet 600 mg  600 mg Oral BID Gina Mull, MD   600 mg at 03/17/14 1050  . heparin ADULT infusion 100 units/mL (25000 units/250 mL)  1,200 Units/hr Intravenous Continuous Julio Sicks, [facility] 12 mL/hr at 03/17/14 1211 1,200 Units/hr at 03/17/14 1211  . ipratropium-albuterol (DUONEB) 0.5-2.5 (3) MG/3ML nebulizer solution 3 mL  3 mL Nebulization Q4H Gina Mull, MD   3 mL at 03/17/14 1153  . levothyroxine (SYNTHROID, LEVOTHROID) tablet 112 mcg  112 mcg Oral QAC breakfast Gina Mull, MD   112 mcg at 03/17/14 0851  . meclizine (ANTIVERT) tablet 12.5 mg  12.5 mg Oral TID PRN Gina Mull, MD      . polyethylene glycol (MIRALAX / GLYCOLAX) packet 17 g  17 g Oral Daily PRN Gina Mull, MD      . Derrill Memo ON 03/18/2014] vancomycin (VANCOCIN) IVPB 750 mg/150 ml premix  750 mg Intravenous Q24H Julio Sicks, RPH      . zolpidem (AMBIEN) tablet 5 mg  5 mg Oral QHS PRN Gina Mull, MD        OBJECTIVE: older white woman examined in bed Filed Vitals:   03/17/14 0518  BP: 125/60  Pulse: 85  Temp: 98.1 F (36.7 C)  Resp: 20     Body mass index is 26.49 kg/(m^2).    ECOG FS:3 - Symptomatic, >50% confined to bed  Ocular: Sclerae unicteric, pupils round and equal Ear-nose-throat: Oropharynx clear, slightly dry Lymphatic: No cervical or supraclavicular adenopathy Lungs auscultated anterolaterally-- no rales or rhonchi Heart regular rate and rhythm Abd soft, nontender, positive bowel sounds MSK no joint edema Neuro: non-focal, well-oriented, appropriate affect Breasts: deferred   LAB RESULTS:  CMP     Component Value  Date/Time   NA 129* 03/17/2014 0900   NA 140 01/06/2014 0914   NA 137 03/07/2011 0844   K 5.3 03/17/2014 0900   K 4.1 01/06/2014 0914   K 4.1 03/07/2011 0844   CL 96 03/17/2014 0900   CL 103 11/10/2012 0939   CL 102 03/07/2011 0844   CO2 23 03/17/2014 0900   CO2 24 01/06/2014 0914   CO2 28 03/07/2011 0844   GLUCOSE 115* 03/17/2014 0900   GLUCOSE 99 01/06/2014 0914   GLUCOSE 74 11/10/2012 0939   GLUCOSE 101 03/07/2011 0844   BUN 31* 03/17/2014 0900   BUN 14.5 01/06/2014 0914   BUN 15 03/07/2011 0844   CREATININE 1.40* 03/17/2014 0900   CREATININE 1.0 01/06/2014 0914   CREATININE 1.3* 03/07/2011 0844   CALCIUM 9.4 03/17/2014 0900   CALCIUM 9.7 01/06/2014 0914   CALCIUM 9.6 03/07/2011 0844   PROT  5.4* 03-19-14 0315   PROT 6.8 01/06/2014 0914   PROT 6.9 03/07/2011 0844   ALBUMIN 2.1* 19-Mar-2014 0315   ALBUMIN 3.4* 01/06/2014 0914   AST 189* Mar 19, 2014 0315   AST 20 01/06/2014 0914   AST 29 03/07/2011 0844   ALT 148* 2014/03/19 0315   ALT 15 01/06/2014 0914   ALT 20 03/07/2011 0844   ALKPHOS 213* 03/19/2014 0315   ALKPHOS 184* 01/06/2014 0914   ALKPHOS 155* 03/07/2011 0844   BILITOT 0.5 03-19-2014 0315   BILITOT 0.41 01/06/2014 0914   BILITOT 0.60 03/07/2011 0844   GFRNONAA 33* 2014/03/19 0900   GFRAA 38* Mar 19, 2014 0900    I No results found for this basename: SPEP, UPEP,  kappa and lambda light chains    Lab Results  Component Value Date   WBC 5.3 03/19/2014   NEUTROABS 4.2 03/19/2014   HGB 9.3* March 19, 2014   HCT 27.6* 2014/03/19   MCV 82.6 2014-03-19   PLT 230 03/19/2014    @LASTCHEMISTRY @  No results found for this basename: LABCA2    No components found with this basename: [id number]     Recent Labs Lab 03/19/14 0110  INR 1.15    Urinalysis No results found for this basename: colorurine, appearanceur, labspec, phurine, glucoseu, hgbur, bilirubinur, ketonesur, proteinur, urobilinogen, nitrite, leukocytesur    STUDIES: US Abdomen Limited Ruq  03/19/2014   CLINICAL DATA:  Elevated liver function tests  EXAM: US  ABDOMEN LIMITED - RIGHT UPPER QUADRANT  COMPARISON:  None.  FINDINGS: Gallbladder:  Multiple gallstones. The largest is 7 mm. No wall thickening. Negative Murphy sign.  Common bile duct:  Diameter: 4.2 mm in caliber.  Liver:  1.1 x 1.1 x 1.1 cm homogeneous hyperechoic lesion in the lateral segment of the left lobe of the liver. Otherwise, there is diffuse homogeneous echogenicity within the liver.  Additional findings:  Right pleural effusion.  IMPRESSION: Cholelithiasis.  1.1 cm hyperechoic lesion in the lateral segment of the left lobe of the liver. This is most likely benign. Followup ultrasound in 6 months is recommended to ensure stability. If the patient has high risk of malignancy or has a history of malignancy, MRI is recommended.  Right pleural effusion.   Electronically Signed   By: Maryclare Bean M.D.   On: 2014-03-19 08:41    ASSESSMENT: 80 y.o. [place] woman,  (1) status post left lower lobectomy in March 2005 for a 3.2 cm, grade 2 lung adenocarcinoma with multiple positive lymph nodes.  (2) Treated adjuvantly with carboplatin, etoposide, and radiation, completed in August 2005.  (3) In July 2006, she had multiple bilateral lung lesions documented to be increasing in size. At that time she was started on erlotinib. After a period of stable disease, the erlotinib (Tarceva) was stopped for a three-month period to see if stability persisted. She had definite disease growth during that period and the erlotinib was resumed. Continues now at 100 mg every other day with good tolerance and stable disease according to most recent chest xray on 01/06/2014.  (4) admitted 03/16/2014 on transfer [facility] with a history of cough, SOB, yellow phlegm, and fever, with a R pleural effusion (5) L UE DVT diagnosed March 19, 2014  PLAN: I discussed advanced directives with the patient and family as documented above. I do expect her to survive this hospitalization, nevertheless a DNR order is appropriate. I  have discussed it with Dr Hartford Poli as well and proceeded to write the order.  I think a Chest CT/angio would be  helpful, and if thoracentesis could be repeated for cytology that might give [place] the answer as to the cause of the patient's current situation. It might also allow [place] to send cancer cells (if that is what we are dealing with) for additional testing which might show sensitivity to other oral agents.  Greatly appreciate your help to this patient! Will follow with you.  Chauncey Cruel, MD   03/17/2014 1:26 PM

## 2014-03-17 NOTE — Progress Notes (Signed)
CARE MANAGEMENT NOTE 03/17/2014  Patient:  [name], [name]   Account Number:  [id number]  Date Initiated:  03/17/2014  Documentation initiated by:  Olga Coaster  Subjective/Objective Assessment:   ADMITTED WITH PNEUMONIA     Action/Plan:   CM FOLLOWING FOR DCP   Anticipated DC Date:  03/22/2014   Anticipated DC Plan:  [place]  CM consult         Status of service:  In process, will continue to follow Medicare Important Message given?   (If response is "NO", the following Medicare IM given date fields will be blank)  Per UR Regulation:  Reviewed for med. necessity/level of care/duration of stay  Comments:  8/5/2015Mindi Slicker RN,BSN,MHA [phone]

## 2014-03-18 ENCOUNTER — Inpatient Hospital Stay (HOSPITAL_COMMUNITY): Payer: Medicare Other

## 2014-03-18 DIAGNOSIS — I319 Disease of pericardium, unspecified: Secondary | ICD-10-CM

## 2014-03-18 LAB — COMPREHENSIVE METABOLIC PANEL
ALT: 118 U/L — ABNORMAL HIGH (ref 0–35)
ANION GAP: 10 (ref 5–15)
AST: 85 U/L — ABNORMAL HIGH (ref 0–37)
Albumin: 2.1 g/dL — ABNORMAL LOW (ref 3.5–5.2)
Alkaline Phosphatase: 205 U/L — ABNORMAL HIGH (ref 39–117)
BUN: 33 mg/dL — AB (ref 6–23)
CALCIUM: 9 mg/dL (ref 8.4–10.5)
CO2: 22 mEq/L (ref 19–32)
Chloride: 95 mEq/L — ABNORMAL LOW (ref 96–112)
Creatinine, Ser: 1.59 mg/dL — ABNORMAL HIGH (ref 0.50–1.10)
GFR, EST AFRICAN AMERICAN: 33 mL/min — AB (ref >90)
GFR, EST NON AFRICAN AMERICAN: 28 mL/min — AB (ref >90)
Glucose, Bld: 127 mg/dL — ABNORMAL HIGH (ref 70–99)
Potassium: 5 mEq/L (ref 3.7–5.3)
SODIUM: 127 meq/L — AB (ref 137–147)
TOTAL PROTEIN: 5.3 g/dL — AB (ref 6.0–8.3)
Total Bilirubin: 0.4 mg/dL (ref 0.3–1.2)

## 2014-03-18 LAB — LACTATE DEHYDROGENASE, PLEURAL OR PERITONEAL FLUID: LD, Fluid: 204 U/L — ABNORMAL HIGH (ref 3–23)

## 2014-03-18 LAB — GLUCOSE, SEROUS FLUID: GLUCOSE FL: 125 mg/dL

## 2014-03-18 LAB — PROTEIN, BODY FLUID: Total protein, fluid: 2.5 g/dL

## 2014-03-18 LAB — AMYLASE, PLEURAL FLUID: Amylase, Pleural Fluid: 22 U/L

## 2014-03-18 LAB — ALBUMIN, FLUID (OTHER): ALBUMIN FL: 1.3 g/dL

## 2014-03-18 LAB — LEGIONELLA ANTIGEN, URINE: Legionella Antigen, Urine: NEGATIVE

## 2014-03-18 LAB — HEPARIN LEVEL (UNFRACTIONATED): Heparin Unfractionated: 0.69 IU/mL (ref 0.30–0.70)

## 2014-03-18 MED ORDER — IPRATROPIUM-ALBUTEROL 0.5-2.5 (3) MG/3ML IN SOLN
3.0000 mL | Freq: Four times a day (QID) | RESPIRATORY_TRACT | Status: DC
  Administered 2014-03-18 – 2014-03-19 (×6): 3 mL via RESPIRATORY_TRACT
  Filled 2014-03-18 (×6): qty 3

## 2014-03-18 MED ORDER — DOCUSATE SODIUM 100 MG PO CAPS
100.0000 mg | ORAL_CAPSULE | Freq: Every day | ORAL | Status: DC
  Administered 2014-03-18 – 2014-03-23 (×5): 100 mg via ORAL
  Filled 2014-03-18 (×6): qty 1

## 2014-03-18 MED ORDER — POLYETHYLENE GLYCOL 3350 17 G PO PACK
17.0000 g | PACK | Freq: Every day | ORAL | Status: DC
  Administered 2014-03-18: 17 g via ORAL
  Filled 2014-03-18 (×2): qty 1

## 2014-03-18 NOTE — Progress Notes (Signed)
[facility] PROGRESS NOTE   [name] JQB:[id number] DOB: 09/12/1924 DOA: 03/16/2014 PCP: Robyn Haber, MD  HPI/Subjective: Had a thoracentesis with removal of 1.2 L of pleural fluid out of the left pleural space. Seen with daughter-in-law at bedside.  Assessment/Plan: Principal Problem:   HCAP (healthcare-associated pneumonia) Active Problems:   Lung cancer, lower lobe   Hypothyroidism   Hypertension   Anemia, unspecified   Septic shock   Hyponatremia   Abnormal LFTs   Fluid overload   Left arm swelling    HCAP (healthcare-associated pneumonia)  -The patient is presenting with complaints of cough, shortness of breath, hypotension requiring pressors.  -She was initially admitted at [facility] and now transferred here for further workup.  -Her chest x-ray has been reviewed and shows bilateral basal changes suspicious for pneumonia.  -She will be treated with continuation of her broad-spectrum antibiotic that she was receiving at the [facility].  -I would continue her on IV vancomycin and cefepime.   Acute DVT, involving left upper extremity -Patient presented with left upper extremity swelling, patient has PICC line in the same extremity. -Doppler ultrasound showed acute DVT in the subclavian vein. -Patient started on heparin drip.  Left lung non-small cell cancer  -Patient appears to have worsening effusion in the site of her prior lobectomy.  -Cytology report is not available but she did have increased lymphocytes on her pleural effusion analysis, LDH was 111.  -I will provide her with incentive spirometry and flutter valve for pulmonary toilet.   Left sided pleural effusion -Status post recent thoracentesis and removal of about 1 L of fluids per son. -Increased lymphocytes, LDH was 111 but no cytology.  -Thoracentesis repeated, waiting on pleural fluid studies to come back.  Hyponatremia  -Volume overload with third  spacing secondary to hypoalbuminemia -The patient appears to have significant volume overload with evidence of bilateral edema and hyponatremia.  -Currently have it all of her labs she may require resumption of her Lasix.  -She recently had received IV fluids resuscitation for her sepsis therefore I would recommend gentle on her diagnosis.  Monitor ins and outs and daily weight   Abnormal LFT  -Patient does not have any abdominal tenderness  -RUQ ultrasound showed no acute abnormalities apart from cholelithiasis, no Murphy's sign. -Patient probably did have a degree of shocked liver and she was admitted for hypotension which required vasopressors.  Hypothyroidism  -Continue Synthroid   Code Status: Full code Family Communication: Plan discussed with the patient. Disposition Plan: Remains inpatient   Consultants:  Oncology  Procedures:  None  Antibiotics:  Vancomycin and cefepime   Objective: Filed Vitals:   03/18/14 1159  BP: 123/55  Pulse:   Temp:   Resp:     Intake/Output Summary (Last 24 hours) at 03/18/14 1456 Last data filed at 03/18/14 0900  Gross per 24 hour  Intake  611.8 ml  Output    700 ml  Net  -88.2 ml   Filed Weights   03/17/14 0336 03/18/14 0659  Weight: 72.213 kg (159 lb 3.2 oz) 71.532 kg (157 lb 11.2 oz)    Exam: General: Alert and awake, oriented x3, not in any acute distress. HEENT: anicteric sclera, pupils reactive to light and accommodation, EOMI CVS: S1-S2 clear, no murmur rubs or gallops Chest: clear to auscultation bilaterally, no wheezing, rales or rhonchi Abdomen: soft nontender, nondistended, normal bowel sounds, no organomegaly Extremities: no cyanosis, clubbing or edema noted bilaterally Neuro: Cranial nerves II-XII intact, no focal  neurological deficits  Data Reviewed: Basic Metabolic Panel:  Recent Labs Lab 03/17/14 0315 03/17/14 0900 03/18/14 0520  NA 126* 129* 127*  K 5.3 5.3 5.0  CL 96 96 95*  CO2 20 23 22     GLUCOSE 113* 115* 127*  BUN 31* 31* 33*  CREATININE 1.32* 1.40* 1.59*  CALCIUM 8.9 9.4 9.0  MG 2.1  --   --   PHOS 2.5  --   --    Liver Function Tests:  Recent Labs Lab 03/17/14 0315 03/18/14 0520  AST 189* 85*  ALT 148* 118*  ALKPHOS 213* 205*  BILITOT 0.5 0.4  PROT 5.4* 5.3*  ALBUMIN 2.1* 2.1*   No results found for this basename: LIPASE, AMYLASE,  in the last 168 hours No results found for this basename: AMMONIA,  in the last 168 hours CBC:  Recent Labs Lab 03/17/14 0110  WBC 5.3  NEUTROABS 4.2  HGB 9.3*  HCT 27.6*  MCV 82.6  PLT 230   Cardiac Enzymes: No results found for this basename: CKTOTAL, CKMB, CKMBINDEX, TROPONINI,  in the last 168 hours BNP (last 3 results)  Recent Labs  03/17/14 0105  PROBNP 2029.0*   CBG: No results found for this basename: GLUCAP,  in the last 168 hours  Micro No results found for this or any previous visit (from the past 240 hour(s)).   Studies: Dg Chest 1 View  03/18/2014   CLINICAL DATA:  Lung cancer. Left pleural effusion. Status post left thoracentesis.  EXAM: CHEST - 1 VIEW  COMPARISON:  CT scan dated 03/17/2014  FINDINGS: There has been marked reduction in the large left pleural effusion with improved aeration in the left lung. There is a moderate right effusion with secondary compressive atelectasis. Pulmonary vascularity is normal. Scarring in the left hilum.  IMPRESSION: No pneumothorax after left thoracentesis. Marked reduction in left effusion. Persistent moderate right effusion.   Electronically Signed   By: Rozetta Nunnery M.D.   On: 03/18/2014 12:32   Ct Angio Chest Pe W/cm &/or Wo Cm  03/17/2014   CLINICAL DATA:  Shortness of breath and weakness.  EXAM: CT ANGIOGRAPHY CHEST WITH CONTRAST  TECHNIQUE: Multidetector CT imaging of the chest was performed using the standard protocol during bolus administration of intravenous contrast. Multiplanar CT image reconstructions and MIPs were obtained to evaluate the vascular  anatomy.  CONTRAST:  185mL OMNIPAQUE IOHEXOL 350 MG/ML SOLN  COMPARISON:  Chest x-ray 01/06/2014.  CT 03/07/2011.  FINDINGS: Thoracic aorta is atherosclerotic. No aneurysm. No dissection. Great vessels are patent. Pulmonary arteries are normal. Coronary artery disease. Moderate pericardial effusion.  Mediastinum difficult to evaluate due to the pericardial effusion and large pleural effusions. The thoracic esophagus is slightly dilated.  Narrowing of both mainstem bronchi noted secondary to the large pleural effusions. Basilar atelectasis. Mild diffuse interstitial prominence. A component of interstitial edema cannot be excluded. Very large bilateral pleural effusions. No pneumothorax.  Thyroid region unremarkable. Shotty axillary lymph nodes. Diffuse anasarca. Prominent degenerative changes thoracic spine.  Review of the MIP images confirms the above findings.  IMPRESSION: 1. No evidence pulmonary embolus. 2. Moderate pericardial effusion. 3. Coronary artery disease. 4. Severe bilateral pleural effusions. Basilar atelectasis. Component of interstitial edema cannot be excluded. Diffuse anasarca. 5. Thoracic esophagus slightly dilated.   Electronically Signed   By: Marcello Moores  Register   On: 03/17/2014 17:27   US Abdomen Limited Ruq  03/17/2014   CLINICAL DATA:  Elevated liver function tests  EXAM: US ABDOMEN LIMITED - RIGHT  UPPER QUADRANT  COMPARISON:  None.  FINDINGS: Gallbladder:  Multiple gallstones. The largest is 7 mm. No wall thickening. Negative Murphy sign.  Common bile duct:  Diameter: 4.2 mm in caliber.  Liver:  1.1 x 1.1 x 1.1 cm homogeneous hyperechoic lesion in the lateral segment of the left lobe of the liver. Otherwise, there is diffuse homogeneous echogenicity within the liver.  Additional findings:  Right pleural effusion.  IMPRESSION: Cholelithiasis.  1.1 cm hyperechoic lesion in the lateral segment of the left lobe of the liver. This is most likely benign. Followup ultrasound in 6 months is  recommended to ensure stability. If the patient has high risk of malignancy or has a history of malignancy, MRI is recommended.  Right pleural effusion.   Electronically Signed   By: Maryclare Bean M.D.   On: 03/17/2014 08:41   US Thoracentesis Asp Pleural Space W/img Guide  03/18/2014   CLINICAL DATA:  Patient with remote history of lung carcinoma, dyspnea, bilateral pleural effusions. Request is made for diagnostic and therapeutic left thoracentesis.  EXAM: ULTRASOUND GUIDED DIAGNOSTIC AND THERAPEUTIC LEFT THORACENTESIS  COMPARISON:  None.  PROCEDURE: An ultrasound guided thoracentesis was thoroughly discussed with the patient and questions answered. The benefits, risks, alternatives and complications were also discussed. The patient understands and wishes to proceed with the procedure. Written consent was obtained.  Ultrasound was performed to localize and mark an adequate pocket of fluid in the left chest. The area was then prepped and draped in the normal sterile fashion. 1% Lidocaine was used for local anesthesia. Under ultrasound guidance a 19 gauge Yueh catheter was introduced. Thoracentesis was performed. The catheter was removed and a dressing applied.  Complications:  None.  FINDINGS: A total of approximately 1.2 liters of slightly turbid, amber fluid was removed. The fluid sample wassent for laboratory analysis.  IMPRESSION: Successful ultrasound guided diagnostic and therapeutic left thoracentesis yielding 1.2 liters of pleural fluid.  Read by: Rowe Robert, PA-C   Electronically Signed   By: Maryclare Bean M.D.   On: 03/18/2014 13:54    Scheduled Meds: . amLODipine  2.5 mg Oral Daily  . aspirin  81 mg Oral Daily  . ceFEPime (MAXIPIME) IV  1 g Intravenous Q24H  . docusate sodium  100 mg Oral Daily  . guaiFENesin  600 mg Oral BID  . ipratropium-albuterol  3 mL Nebulization Q6H  . levothyroxine  112 mcg Oral QAC breakfast  . polyethylene glycol  17 g Oral Daily  . vancomycin  750 mg Intravenous Q24H     Continuous Infusions: . heparin 1,200 Units/hr (03/18/14 1245)       Time spent: 35 minutes    [facility]  [facility] Pager [phone] If 7PM-7AM, please contact night-coverage at [email or web address], password [email or web address] 03/18/2014, 2:56 PM  LOS: 2 days

## 2014-03-18 NOTE — Evaluation (Signed)
Physical Therapy Evaluation Patient Details Name: [name] MRN: [id number] DOB: 1925/01/15 Today's Date: 03/18/2014   History of Present Illness  80 yo female adm  03/16/14 with cough, SOB and LUE swelling; Pt was found to have HCAP and LUE DVT; she had thoracentesis 03/18/14 rem,oving 1.2L of fluid; PMHx: CHF, HTN, NSCLC-s/p lobectomy in 2005  Clinical Impression  Pt will benefit from PT to address deficits below; she is quite deconditioned and family in agreement with plan for SNF;    Follow Up Recommendations SNF    Equipment Recommendations  None recommended by PT    Recommendations for Other Services       Precautions / Restrictions Precautions Precautions: Fall      Mobility  Bed Mobility Overal bed mobility: Needs Assistance Bed Mobility: Supine to Sit     Supine to sit: Min assist     General bed mobility comments: incr time and cues to self assist  Transfers Overall transfer level: Needs assistance Equipment used: Rolling walker (2 wheeled) Transfers: Sit to/from [employer] Sit to Stand: Min assist;Mod assist Stand pivot transfers: Min assist;Mod assist;+2 safety/equipment       General transfer comment: incr time and cues for hand placement, safety, control of descent; repeated multiple times for toileting needs/pt request  Ambulation/Gait Ambulation/Gait assistance: Min assist Ambulation Distance (Feet): 40 Feet Assistive device: Rolling walker (2 wheeled) Gait Pattern/deviations: Step-through pattern;Decreased stride length;Trunk flexed   Gait velocity interpretation: Below normal speed for age/gender General Gait Details: multi-modal cues for posture, breathing, RW technique, direction and safety  Stairs            Wheelchair Mobility    Modified Rankin (Stroke Patients Only)       Balance Overall balance assessment: Needs assistance           Standing balance-Leahy Scale: Poor                                Pertinent Vitals/Pain Pain Assessment: 0-10 Pain Score: 2  Pain Location: L side/back Pain Intervention(s): Limited activity within patient's tolerance;Monitored during session    Home Living Family/patient expects to be discharged to:: Skilled nursing facility Living Arrangements: Alone   Type of Home: House Home Access: Stairs to enter   [employer] of Steps: 4 or 2 or 1 Home Layout: One level Home Equipment: Walker - 2 wheels;Cane - single point      Prior Function Level of Independence: Independent               Hand Dominance        Extremity/Trunk Assessment   Upper Extremity Assessment: Defer to OT evaluation           Lower Extremity Assessment: Generalized weakness         Communication   Communication: No difficulties  Cognition Arousal/Alertness: Awake/alert Behavior During Therapy: WFL for tasks assessed/performed Overall Cognitive Status: Within Functional Limits for tasks assessed                      General Comments      Exercises        Assessment/Plan    PT Assessment Patient needs continued PT services  PT Diagnosis Difficulty walking;Generalized weakness   PT Problem List Decreased strength;Decreased activity tolerance;Decreased balance;Decreased mobility;Decreased knowledge of use of DME  PT Treatment Interventions DME instruction;Gait training;Functional mobility training;Therapeutic activities;Therapeutic exercise;Patient/family education  PT Goals (Current goals can be found in the Care Plan section) Acute Rehab PT Goals Patient Stated Goal: be independent again PT Goal Formulation: With patient/family Time For Goal Achievement: 04/01/14 Potential to Achieve Goals: Good    Frequency Min 3X/week   Barriers to discharge        Co-evaluation               End of Session Equipment Utilized During Treatment: Gait belt Activity Tolerance: Patient limited by  fatigue Patient left: Other (comment);with call bell/phone within reach (3in1, RN and family aware) Nurse Communication: Mobility status         Time: [phone] PT Time Calculation (min): 24 min   Charges:   PT Evaluation $Initial PT Evaluation Tier I: 1 Procedure PT Treatments $Gait Training: 8-22 mins $Therapeutic Activity: 8-22 mins   PT G Codes:          Chivonne Rascon 2014/04/16, 3:43 PM

## 2014-03-18 NOTE — Progress Notes (Signed)
ANTICOAGULATION CONSULT NOTE - Follow Up Consult  Pharmacy Consult for Heparin Indication: DVT  Allergies  Allergen Reactions  . Keflex [Cephalexin] Rash  . Sulfa Antibiotics Nausea And Vomiting  . Other     Pt states antibiotics--unable to remember names of meds  . Paroxetine Hcl Anxiety  . Ramipril [Ramipril] Rash and Cough  . Sertraline Anxiety    Patient Measurements: Height: 5\' 5"  (165.1 cm) Weight: 159 lb 3.2 oz (72.213 kg) IBW/kg (Calculated) : 57 Heparin Dosing Weight:   Vital Signs: Temp: 98.8 F (37.1 C) (08/05 2243) Temp src: Oral (08/05 2243) BP: 104/52 mmHg (08/05 2243) Pulse Rate: 102 (08/05 2243)  Labs:  Recent Labs  03/17/14 0110 03/17/14 0315 03/17/14 0900 03/17/14 2205  HGB 9.3*  --   --   --   HCT 27.6*  --   --   --   PLT 230  --   --   --   APTT 36  --   --   --   LABPROT 14.7  --   --   --   INR 1.15  --   --   --   HEPARINUNFRC  --   --   --  0.66  CREATININE  --  1.32* 1.40*  --     Estimated Creatinine Clearance: 28.7 ml/min (by C-G formula based on Cr of 1.4).   Medications:  Infusions:  . heparin 1,200 Units/hr (03/17/14 1211)    Assessment: Patient with heparin level at goal.  No issues noted by RN.  Goal of Therapy:  Heparin level 0.3-0.7 units/ml Monitor platelets by anticoagulation protocol: Yes   Plan:  Continue heparin drip at current rate Recheck level with AM labs  Tyler Deis, Shea Stakes Crowford 03/18/2014,4:41 AM

## 2014-03-18 NOTE — Progress Notes (Signed)
ANTICOAGULATION CONSULT NOTE - Follow Up Consult  Pharmacy Consult for Heparin Indication: DVT  Allergies  Allergen Reactions  . Keflex [Cephalexin] Rash  . Sulfa Antibiotics Nausea And Vomiting  . Other     Pt states antibiotics--unable to remember names of meds  . Paroxetine Hcl Anxiety  . Ramipril [Ramipril] Rash and Cough  . Sertraline Anxiety    Patient Measurements: Height: 5\' 5"  (165.1 cm) Weight: 157 lb 11.2 oz (71.532 kg) IBW/kg (Calculated) : 57  Vital Signs: Temp: 98.1 F (36.7 C) (08/06 0649) Temp src: Oral (08/06 0649) BP: 106/52 mmHg (08/06 0649) Pulse Rate: 97 (08/06 0649)  Labs:  Recent Labs  03/17/14 0110 03/17/14 0315 03/17/14 0900 03/17/14 2205 03/18/14 0520  HGB 9.3*  --   --   --   --   HCT 27.6*  --   --   --   --   PLT 230  --   --   --   --   APTT 36  --   --   --   --   LABPROT 14.7  --   --   --   --   INR 1.15  --   --   --   --   HEPARINUNFRC  --   --   --  0.66 0.69  CREATININE  --  1.32* 1.40*  --   --     Estimated Creatinine Clearance: 28.6 ml/min (by C-G formula based on Cr of 1.4).   Medications:  Infusions:  . heparin 1,200 Units/hr (03/17/14 1211)    Assessment: Patient with heparin level at goal.   Goal of Therapy:  Heparin level 0.3-0.7 units/ml Monitor platelets by anticoagulation protocol: Yes   Plan:  Continue heparin drip at current rate Follow daily heparin levels  Kizzie Furnish, PharmD Pager: [phone] 03/18/2014 7:08 AM

## 2014-03-18 NOTE — Procedures (Signed)
US guided diagnostic/therapeutic left thoracentesis performed yielding 1.2 liters slightly turbid, amber fluid. The fluid was sent to the lab for preordered studies. No immediate complications. F/u CXR pending.

## 2014-03-18 NOTE — Progress Notes (Signed)
PT Cancellation Note  Patient Details Name: [name] MRN: [id number] DOB: [birth year]   Cancelled Treatment:    Reason Eval/Treat Not Completed: Medical issues which prohibited therapy;  Pt with LUE DVT-- will need to be on Heparin for 24hrs prior to PT eval.    [facility] 03/18/2014, 9:30 AM

## 2014-03-18 NOTE — Progress Notes (Signed)
Echocardiogram 2D Echocardiogram has been performed.  [name] 03/18/2014, 9:31 AM

## 2014-03-18 NOTE — Progress Notes (Signed)
OT Cancellation Note  Patient Details Name: [name] MRN: [id number] DOB: 1925-07-23   Cancelled Treatment:    Reason Eval/Treat Not Completed: Other (comment) Note pt for thoracentesis soon so will check back later today or in am.  Jules Schick [phone] 03/18/2014, 10:31 AM

## 2014-03-18 NOTE — Progress Notes (Signed)
[name]   DOB:18-Feb-1925   CN#:[id number]   OQH#:[id number]  Subjective: patient alert, comfortable, tells me less cough; getting up to urinate "a lot;" does not feel dizzy when doing so; no BM at least 48 h. Discussed CT results w patient and son   Objective: elderly White woman examined in bed Filed Vitals:   03/18/14 0649  BP: 106/52  Pulse: 97  Temp: 98.1 F (36.7 C)  Resp: 20    Body mass index is 26.24 kg/(m^2).  Intake/Output Summary (Last 24 hours) at 03/18/14 0733 Last data filed at 03/18/14 0653  Gross per 24 hour  Intake  731.8 ml  Output    700 ml  Net   31.8 ml     Sclerae unicteric  No peripheral adenopathy  Lungs no rales or rhonchi--auscultated anterolaterally  Heart regular rate and rhythm  Abdomen benign  Neuro nonfocal-- mild/moderate deafness    CBG (last 3)  No results found for this basename: GLUCAP,  in the last 72 hours   Labs:  Lab Results  Component Value Date   WBC 5.3 03/17/2014   HGB 9.3* 03/17/2014   HCT 27.6* 03/17/2014   MCV 82.6 03/17/2014   PLT 230 03/17/2014   NEUTROABS 4.2 03/17/2014    @LASTCHEMISTRY @  Urine Studies No results found for this basename: UACOL, UAPR, USPG, UPH, UTP, UGL, UKET, UBIL, UHGB, UNIT, UROB, ULEU, UEPI, UWBC, URBC, UBAC, CAST, CRYS, UCOM, BILUA,  in the last 72 hours  Basic Metabolic Panel:  Recent Labs Lab 03/17/14 0315 03/17/14 0900  NA 126* 129*  K 5.3 5.3  CL 96 96  CO2 20 23  GLUCOSE 113* 115*  BUN 31* 31*  CREATININE 1.32* 1.40*  CALCIUM 8.9 9.4  MG 2.1  --   PHOS 2.5  --    GFR Estimated Creatinine Clearance: 28.6 ml/min (by C-G formula based on Cr of 1.4). Liver Function Tests:  Recent Labs Lab 03/17/14 0315  AST 189*  ALT 148*  ALKPHOS 213*  BILITOT 0.5  PROT 5.4*  ALBUMIN 2.1*   No results found for this basename: LIPASE, AMYLASE,  in the last 168 hours No results found for this basename: AMMONIA,  in the last 168 hours Coagulation profile  Recent Labs Lab  03/17/14 0110  INR 1.15    CBC:  Recent Labs Lab 03/17/14 0110  WBC 5.3  NEUTROABS 4.2  HGB 9.3*  HCT 27.6*  MCV 82.6  PLT 230   Cardiac Enzymes: No results found for this basename: CKTOTAL, CKMB, CKMBINDEX, TROPONINI,  in the last 168 hours BNP: No components found with this basename: POCBNP,  CBG: No results found for this basename: GLUCAP,  in the last 168 hours D-Dimer No results found for this basename: DDIMER,  in the last 72 hours Hgb A1c No results found for this basename: HGBA1C,  in the last 72 hours Lipid Profile No results found for this basename: CHOL, HDL, LDLCALC, TRIG, CHOLHDL, LDLDIRECT,  in the last 72 hours Thyroid function studies No results found for this basename: TSH, T4TOTAL, FREET3, T3FREE, THYROIDAB,  in the last 72 hours Anemia work up No results found for this basename: VITAMINB12, FOLATE, FERRITIN, TIBC, IRON, RETICCTPCT,  in the last 72 hours Microbiology No results found for this or any previous visit (from the past 240 hour(s)).    Studies:  Ct Angio Chest Pe W/cm &/or Wo Cm  03/17/2014   CLINICAL DATA:  Shortness of breath and weakness.  EXAM: CT ANGIOGRAPHY CHEST  WITH CONTRAST  TECHNIQUE: Multidetector CT imaging of the chest was performed using the standard protocol during bolus administration of intravenous contrast. Multiplanar CT image reconstructions and MIPs were obtained to evaluate the vascular anatomy.  CONTRAST:  171mL OMNIPAQUE IOHEXOL 350 MG/ML SOLN  COMPARISON:  Chest x-ray 01/06/2014.  CT 03/07/2011.  FINDINGS: Thoracic aorta is atherosclerotic. No aneurysm. No dissection. Great vessels are patent. Pulmonary arteries are normal. Coronary artery disease. Moderate pericardial effusion.  Mediastinum difficult to evaluate due to the pericardial effusion and large pleural effusions. The thoracic esophagus is slightly dilated.  Narrowing of both mainstem bronchi noted secondary to the large pleural effusions. Basilar atelectasis. Mild  diffuse interstitial prominence. A component of interstitial edema cannot be excluded. Very large bilateral pleural effusions. No pneumothorax.  Thyroid region unremarkable. Shotty axillary lymph nodes. Diffuse anasarca. Prominent degenerative changes thoracic spine.  Review of the MIP images confirms the above findings.  IMPRESSION: 1. No evidence pulmonary embolus. 2. Moderate pericardial effusion. 3. Coronary artery disease. 4. Severe bilateral pleural effusions. Basilar atelectasis. Component of interstitial edema cannot be excluded. Diffuse anasarca. 5. Thoracic esophagus slightly dilated.   Electronically Signed   By: Marcello Moores  Register   On: 03/17/2014 17:27   US Abdomen Limited Ruq  03/17/2014   CLINICAL DATA:  Elevated liver function tests  EXAM: US ABDOMEN LIMITED - RIGHT UPPER QUADRANT  COMPARISON:  None.  FINDINGS: Gallbladder:  Multiple gallstones. The largest is 7 mm. No wall thickening. Negative Murphy sign.  Common bile duct:  Diameter: 4.2 mm in caliber.  Liver:  1.1 x 1.1 x 1.1 cm homogeneous hyperechoic lesion in the lateral segment of the left lobe of the liver. Otherwise, there is diffuse homogeneous echogenicity within the liver.  Additional findings:  Right pleural effusion.  IMPRESSION: Cholelithiasis.  1.1 cm hyperechoic lesion in the lateral segment of the left lobe of the liver. This is most likely benign. Followup ultrasound in 6 months is recommended to ensure stability. If the patient has high risk of malignancy or has a history of malignancy, MRI is recommended.  Right pleural effusion.   Electronically Signed   By: Maryclare Bean M.D.   On: 03/17/2014 08:41    Assessment: 80 y.o. [place] woman,  (1) status post left lower lobectomy in March 2005 for a 3.2 cm, grade 2 lung adenocarcinoma with multiple positive lymph nodes.  (2) Treated adjuvantly with carboplatin, etoposide, and radiation, completed in August 2005.  (3) In July 2006, she had multiple bilateral lung lesions documented  to be increasing in size. At that time she was started on erlotinib. After a period of stable disease, the erlotinib (Tarceva) was stopped for a three-month period to see if stability persisted. She had definite disease growth during that period and the erlotinib was resumed. Continues now at 100 mg every other day with good tolerance and stable disease according to most recent chest xray on 01/06/2014.  (4) admitted 03/16/2014 on transfer from [facility] with a history of cough, SOB, yellow phlegm, and fever, with bilateral pleural effusions, periardial effusion (5) L UE DVT diagnosed 03/17/2014  Plan: I suppose it's possible for cancer to be causing her effusions but that would not be on top of my list, especially as we don't see obvious tumor nodules along the pleurae or in the lung parenchyma. I will write for L thoracentesis to be send w cytology and for echo. Patient's heparin may need to be interrupted prior to the procedure at the discretion of IR.  Will encourage OOBTC and review bowel prophylaxis. Would consider PT/OT eval w a view to possible SNF discharge next week  Discussed with patient. Will follow with you.   Chauncey Cruel, MD 03/18/2014  7:33 AM

## 2014-03-19 LAB — COMPREHENSIVE METABOLIC PANEL
ALK PHOS: 206 U/L — AB (ref 39–117)
ALT: 84 U/L — ABNORMAL HIGH (ref 0–35)
AST: 39 U/L — ABNORMAL HIGH (ref 0–37)
Albumin: 2.1 g/dL — ABNORMAL LOW (ref 3.5–5.2)
Anion gap: 12 (ref 5–15)
BILIRUBIN TOTAL: 0.7 mg/dL (ref 0.3–1.2)
BUN: 35 mg/dL — AB (ref 6–23)
CHLORIDE: 95 meq/L — AB (ref 96–112)
CO2: 19 meq/L (ref 19–32)
CREATININE: 1.62 mg/dL — AB (ref 0.50–1.10)
Calcium: 9.1 mg/dL (ref 8.4–10.5)
GFR calc Af Amer: 32 mL/min — ABNORMAL LOW (ref >90)
GFR, EST NON AFRICAN AMERICAN: 28 mL/min — AB (ref >90)
Glucose, Bld: 160 mg/dL — ABNORMAL HIGH (ref 70–99)
POTASSIUM: 5.5 meq/L — AB (ref 3.7–5.3)
Sodium: 126 mEq/L — ABNORMAL LOW (ref 137–147)
Total Protein: 5.5 g/dL — ABNORMAL LOW (ref 6.0–8.3)

## 2014-03-19 LAB — HEPARIN LEVEL (UNFRACTIONATED): Heparin Unfractionated: 0.61 IU/mL (ref 0.30–0.70)

## 2014-03-19 MED ORDER — APIXABAN 5 MG PO TABS: 5.0000 mg | ORAL_TABLET | Freq: Two times a day (BID) | ORAL | Status: DC

## 2014-03-19 MED ORDER — POLYETHYLENE GLYCOL 3350 17 G PO PACK
17.0000 g | PACK | Freq: Every day | ORAL | Status: DC
Start: 2014-03-19 — End: 2014-03-23
  Administered 2014-03-19 – 2014-03-23 (×4): 17 g via ORAL
  Filled 2014-03-19 (×5): qty 1

## 2014-03-19 MED ORDER — APIXABAN 5 MG PO TABS
10.0000 mg | ORAL_TABLET | Freq: Two times a day (BID) | ORAL | Status: DC
  Administered 2014-03-19 – 2014-03-22 (×7): 10 mg via ORAL
  Filled 2014-03-19 (×8): qty 2

## 2014-03-19 MED ORDER — MAGNESIUM HYDROXIDE 400 MG/5ML PO SUSP
30.0000 mL | Freq: Once | ORAL | Status: AC
  Administered 2014-03-19: 30 mL via ORAL
  Filled 2014-03-19: qty 30

## 2014-03-19 MED ORDER — IPRATROPIUM-ALBUTEROL 0.5-2.5 (3) MG/3ML IN SOLN
3.0000 mL | Freq: Three times a day (TID) | RESPIRATORY_TRACT | Status: DC
  Administered 2014-03-20 – 2014-03-23 (×9): 3 mL via RESPIRATORY_TRACT
  Filled 2014-03-19 (×11): qty 3

## 2014-03-19 NOTE — Progress Notes (Signed)
[name]   DOB:[birth year]   QP#:[id number]   ZLD#:[id number]  Subjective: patient was seen briefly this AM; she had a fainting spell coming back from thoracentesis yesterday but denies pain; she does not feel the procedure helped her breathing any.   Objective: elderly White woman examined in bed Filed Vitals:   03/19/14 2133  BP: 108/55  Pulse: 98  Temp: 99.1 F (37.3 C)  Resp: 20    Body mass index is 25.71 kg/(m^2).  Intake/Output Summary (Last 24 hours) at 03/19/14 2200 Last data filed at 03/19/14 1826  Gross per 24 hour  Intake   1409 ml  Output    300 ml  Net   1109 ml     Sclerae unicteric  No peripheral adenopathy  Lungs no rales or rhonchi--auscultated anterolaterally  Heart regular rate and rhythm  Abdomen benign  Neuro nonfocal-- mild/moderate deafness    CBG (last 3)  No results found for this basename: GLUCAP,  in the last 72 hours   Labs:  Lab Results  Component Value Date   WBC 5.3 03/17/2014   HGB 9.3* 03/17/2014   HCT 27.6* 03/17/2014   MCV 82.6 03/17/2014   PLT 230 03/17/2014   NEUTROABS 4.2 03/17/2014    @LASTCHEMISTRY @  Urine Studies No results found for this basename: UACOL, UAPR, USPG, UPH, UTP, UGL, UKET, UBIL, UHGB, UNIT, UROB, ULEU, UEPI, UWBC, URBC, UBAC, CAST, CRYS, UCOM, BILUA,  in the last 72 hours  Basic Metabolic Panel:  Recent Labs Lab 03/17/14 0315 03/17/14 0900 03/18/14 0520 03/19/14 0420  NA 126* 129* 127* 126*  K 5.3 5.3 5.0 5.5*  CL 96 96 95* 95*  CO2 20 23 22 19   GLUCOSE 113* 115* 127* 160*  BUN 31* 31* 33* 35*  CREATININE 1.32* 1.40* 1.59* 1.62*  CALCIUM 8.9 9.4 9.0 9.1  MG 2.1  --   --   --   PHOS 2.5  --   --   --    GFR Estimated Creatinine Clearance: 24.5 ml/min (by C-G formula based on Cr of 1.62). Liver Function Tests:  Recent Labs Lab 03/17/14 0315 03/18/14 0520 03/19/14 0420  AST 189* 85* 39*  ALT 148* 118* 84*  ALKPHOS 213* 205* 206*  BILITOT 0.5 0.4 0.7  PROT 5.4* 5.3* 5.5*  ALBUMIN 2.1*  2.1* 2.1*   No results found for this basename: LIPASE, AMYLASE,  in the last 168 hours No results found for this basename: AMMONIA,  in the last 168 hours Coagulation profile  Recent Labs Lab 03/17/14 0110  INR 1.15    CBC:  Recent Labs Lab 03/17/14 0110  WBC 5.3  NEUTROABS 4.2  HGB 9.3*  HCT 27.6*  MCV 82.6  PLT 230   Cardiac Enzymes: No results found for this basename: CKTOTAL, CKMB, CKMBINDEX, TROPONINI,  in the last 168 hours BNP: No components found with this basename: POCBNP,  CBG: No results found for this basename: GLUCAP,  in the last 168 hours D-Dimer No results found for this basename: DDIMER,  in the last 72 hours Hgb A1c No results found for this basename: HGBA1C,  in the last 72 hours Lipid Profile No results found for this basename: CHOL, HDL, LDLCALC, TRIG, CHOLHDL, LDLDIRECT,  in the last 72 hours Thyroid function studies No results found for this basename: TSH, T4TOTAL, FREET3, T3FREE, THYROIDAB,  in the last 72 hours Anemia work up No results found for this basename: VITAMINB12, FOLATE, FERRITIN, TIBC, IRON, RETICCTPCT,  in the last 72 hours  Microbiology Recent Results (from the past 240 hour(s))  BODY FLUID CULTURE     Status: None   Collection Time    03/18/14 12:01 PM      Result Value Ref Range Status   Specimen Description PLEURAL   Final   Special Requests Normal   Final   Gram Stain     Final   Value: NO WBC SEEN     NO SQUAMOUS EPITHELIAL CELLS SEEN     NO ORGANISMS SEEN     Performed at [facility]   Culture     Final   Value: NO GROWTH 1 DAY     Performed at [facility]   Report Status PENDING   Incomplete      Studies:  Transthoracic Echocardiography  (Report amended )  Patient: Mae, Denunzio MR #: [id number] Study Date: 03/18/2014 Gender: F Age: 80 Height: 165.1 cm Weight: 71.2 kg BSA: 1.82 m^2 Pt. Status: Room: [facility] Takhia Spoon, [place], RDCS ATTENDING [place], [place] PERFORMING [facility], Inpatient  cc:  ------------------------------------------------------------------- LV EF: 65% - 70%  Patient: BRIT, CARBONELL Collected: 03/18/2014 Client: [facility] Accession: [id number] Received: 03/18/2014 Rowe Robert, PA-C DOB: [birth year] Age: 52 Gender: F Reported: 03/19/2014 [address] Patient Ph: [phone] MRN#: [id number] [place], [place] [address] Client Acc#: Chart: Phone: [phone] Fax: LMP: Visit#: [id number] CC: Art Hoss Sarajane Jews [facility] [place] CYTOPATHOLOGY REPORT Adequacy Reason Satisfactory For Evaluation. Diagnosis PLEURAL FLUID, LEFT(SPECIMEN 1 OF 1 COLLECTED 03/18/14): REACTIVE MESOTHELIAL CELLS PRESENT. ACUTE INFLAMMATION. Vicente Males MD Pathologist, Electronic Signature (Case signed 03/19/2014) Specimen Clinical Information history of lung cancer Source Pleural Fluid, Left, (specimen 1 of 1 collected 03/18/14) Gross Specimen: Received is/are 1,120cc's of tan fluid. (BS:bs) Prepared: # Smears: 0 # Concentration Technique Slides (i.e. ThinPrep): 1 # Cell Block: 1 Additional Studies: n/a Report signed  Dg Chest 1 View  03/18/2014   CLINICAL DATA:  Lung cancer. Left pleural effusion. Status post left thoracentesis.  EXAM: CHEST - 1 VIEW  COMPARISON:  CT scan dated 03/17/2014  FINDINGS: There has been marked reduction in the large left pleural effusion with improved aeration in the left lung. There is a moderate right effusion with secondary compressive atelectasis. Pulmonary vascularity is normal. Scarring in the left hilum.  IMPRESSION: No pneumothorax after left thoracentesis. Marked reduction in left effusion. Persistent moderate right effusion.   Electronically Signed   By: Rozetta Nunnery M.D.   On: 03/18/2014 12:32   US Thoracentesis Asp Pleural Space W/img Guide  03/18/2014   CLINICAL DATA:  Patient with remote history of lung carcinoma,  dyspnea, bilateral pleural effusions. Request is made for diagnostic and therapeutic left thoracentesis.  EXAM: ULTRASOUND GUIDED DIAGNOSTIC AND THERAPEUTIC LEFT THORACENTESIS  COMPARISON:  None.  PROCEDURE: An ultrasound guided thoracentesis was thoroughly discussed with the patient and questions answered. The benefits, risks, alternatives and complications were also discussed. The patient understands and wishes to proceed with the procedure. Written consent was obtained.  Ultrasound was performed to localize and mark an adequate pocket of fluid in the left chest. The area was then prepped and draped in the normal sterile fashion. 1% Lidocaine was used for local anesthesia. Under ultrasound guidance a 19 gauge Yueh catheter was introduced. Thoracentesis was performed. The catheter was removed and a dressing applied.  Complications:  None.  FINDINGS: A total of approximately 1.2 liters of slightly turbid, amber fluid was removed. The  fluid sample wassent for laboratory analysis.  IMPRESSION: Successful ultrasound guided diagnostic and therapeutic left thoracentesis yielding 1.2 liters of pleural fluid.  Read by: Rowe Robert, PA-C   Electronically Signed   By: Maryclare Bean M.D.   On: 03/18/2014 13:54    Assessment: 80 y.o. [place] woman,  (1) status post left lower lobectomy in March 2005 for a 3.2 cm, grade 2 lung adenocarcinoma with multiple positive lymph nodes.  (2) Treated adjuvantly with carboplatin, etoposide, and radiation, completed in August 2005.  (3) In July 2006, she had multiple bilateral lung lesions documented to be increasing in size. At that time she was started on erlotinib. After a period of stable disease, the erlotinib (Tarceva) was stopped for a three-month period to see if stability persisted. She had definite disease growth during that period and the erlotinib was resumed. Continues now at 100 mg every other day with good tolerance and stable disease according to most recent chest xray on  01/06/2014.  (4) admitted 03/16/2014 on transfer from [facility] with a history of cough, SOB, yellow phlegm, and fever, with bilateral pleural effusions, periardial effusion (5) L UE DVT diagnosed 03/17/2014 (6) repeat thoracentesis shows only acute inflammation  Plan: At this point I still do not know the cause of the patient's effusions. It is not clear that we are dealing with metastatic lung cancer--do not see that on the CT and the cytology does not show it. The sensitivity of pleural fluid cytology is about 50%, but cancer associated effusions tend to be bloody, which hers wasn't.  I wonder if a consult to the pulmonary service might be the best next step.  Greatly appreciate the hospitalist service's excellent care to this patient!   I will be out of town until 03/29/2014. Please contact my partners until then if we can be of help.      Chauncey Cruel, MD 03/19/2014  10:00 PM

## 2014-03-19 NOTE — Progress Notes (Signed)
ANTICOAGULATION CONSULT NOTE - Follow Up Consult  Pharmacy Consult for Heparin Indication: DVT  Allergies  Allergen Reactions  . Keflex [Cephalexin] Rash  . Sulfa Antibiotics Nausea And Vomiting  . Other     Pt states antibiotics--unable to remember names of meds  . Paroxetine Hcl Anxiety  . Ramipril [Ramipril] Rash and Cough  . Sertraline Anxiety    Patient Measurements: Height: 5\' 5"  (165.1 cm) Weight: 154 lb 8 oz (70.081 kg) IBW/kg (Calculated) : 57  Vital Signs: Temp: 99.6 F (37.6 C) (08/07 0600) Temp src: Oral (08/07 0600) BP: 86/49 mmHg (08/07 0600) Pulse Rate: 112 (08/07 0600)  Labs:  Recent Labs  03/17/14 0110  03/17/14 0900 03/17/14 2205 03/18/14 0520 03/19/14 0420  HGB 9.3*  --   --   --   --   --   HCT 27.6*  --   --   --   --   --   PLT 230  --   --   --   --   --   APTT 36  --   --   --   --   --   LABPROT 14.7  --   --   --   --   --   INR 1.15  --   --   --   --   --   HEPARINUNFRC  --   --   --  0.66 0.69 0.61  CREATININE  --   < > 1.40*  --  1.59* 1.62*  < > = values in this interval not displayed.  Estimated Creatinine Clearance: 24.5 ml/min (by C-G formula based on Cr of 1.62).   Medications:  Infusions:  . heparin 1,200 Units/hr (03/18/14 1245)    Assessment: 26 yof with PMH of NSCLC s/p lobectomy in 2005 with adjuvent chemo and radiation on Tarceva since 2006, hypothyroidism, HTN, CHF. Venus duplex revealed left upper extremity DVT. Pharmacy to start IV heparin.  8/7  0420 Heparin level = 0.61 (therapeutic) @ rate of 1200units/hr  Goal of Therapy:  Heparin level 0.3-0.7 units/ml Monitor platelets by anticoagulation protocol: Yes   Plan:  Continue heparin drip at current rate Follow daily heparin levels  Kizzie Furnish, PharmD Pager: [phone] 03/19/2014 7:03 AM

## 2014-03-19 NOTE — Progress Notes (Signed)
Clinical Social Work Department CLINICAL SOCIAL WORK PLACEMENT NOTE 03/19/2014  Patient:  [name], [name]  Account Number:  [id number] Admit date:  03/16/2014  Clinical Social Worker:  Renold Genta  Date/time:  03/19/2014 12:19 PM  Clinical Social Work is seeking post-discharge placement for this patient at the following level of care:   SKILLED NURSING   (*CSW will update this form in Epic as items are completed)   03/19/2014  Patient/family provided with [facility] Department of Clinical Social Work's list of facilities offering this level of care within the geographic area requested by the patient (or if unable, by the patient's family).  03/19/2014  Patient/family informed of their freedom to choose among providers that offer the needed level of care, that participate in Medicare, Medicaid or managed care program needed by the patient, have an available bed and are willing to accept the patient.  03/19/2014  Patient/family informed of [facility]' ownership interest in [facility], as well as of the fact that they are under no obligation to receive care at this facility.  PASARR submitted to EDS on 03/19/2014 PASARR number received on 03/19/2014  FL2 transmitted to all facilities in geographic area requested by pt/family on  03/19/2014 FL2 transmitted to all facilities within larger geographic area on   Patient informed that his/her managed care company has contracts with or will negotiate with  certain facilities, including the following:     Patient/family informed of bed offers received:  03/19/2014 Patient chooses bed at  Physician recommends and patient chooses bed at    Patient to be transferred to  on   Patient to be transferred to facility by  Patient and family notified of transfer on  Name of family member notified:    The following physician request were entered in Epic:   Additional Comments:   Raynaldo Opitz, [place] Social Worker cell #: [phone]

## 2014-03-19 NOTE — Evaluation (Signed)
Occupational Therapy Evaluation Patient Details Name: [name] MRN: [id number] DOB: 1925/05/25 Today's Date: 03/19/2014    History of Present Illness 80 yo female adm  03/16/14 with cough, SOB and LUE swelling; Pt was found to have HCAP and LUE DVT; she had thoracentesis 03/18/14 removing 1.2L of fluid; PMHx: CHF, HTN, NSCLC-s/p lobectomy in 2005   Clinical Impression   Pt was admitted for the above.  Prior to admission, pt reports she was mod I with adls but struggled at times.  She currently needs mod to max A for adls and would benefit from continued OT in acute as well as follow up OT in SNF.  Acute goals are set for min to mod A.    Follow Up Recommendations  SNF    Equipment Recommendations  3 in 1 bedside comode (if she doesn't already have)    Recommendations for Other Services       Precautions / Restrictions Precautions Precautions: Fall Precaution Comments: L UE DVT Restrictions Weight Bearing Restrictions: No      Mobility Bed Mobility           Sit to supine: Mod assist   General bed mobility comments: assist for BLEs  Transfers Overall transfer level: Needs assistance Equipment used: Rolling walker (2 wheeled) Transfers: Sit to/from Stand Sit to Stand: Mod assist         General transfer comment: increased time; leans posteriorly initially. cues for hand placement and decreased control when sitting    Balance                                            ADL Overall ADL's : Needs assistance/impaired     Grooming: Set up;Sitting   Upper Body Bathing: Set up;Sitting   Lower Body Bathing: Moderate assistance;Sit to/from stand   Upper Body Dressing : Minimal assistance;Sitting   Lower Body Dressing: Maximal assistance;Sit to/from stand   Toilet Transfer: Moderate assistance;BSC;Stand-pivot (for sit to stand; min A for steps)   Toileting- Clothing Manipulation and Hygiene: Moderate assistance;Sit to/from stand          General ADL Comments: Attempted to use commode without results.  Pt limited with adls but fatique     Vision                     Perception     Praxis      Pertinent Vitals/Pain Pain Assessment: No/denies pain     Hand Dominance Right   Extremity/Trunk Assessment Upper Extremity Assessment Upper Extremity Assessment: Generalized weakness (RUE DVT)           Communication Communication Communication: No difficulties   Cognition Arousal/Alertness: Awake/alert Behavior During Therapy: WFL for tasks assessed/performed Overall Cognitive Status: Within Functional Limits for tasks assessed                     General Comments       Exercises       Shoulder Instructions      Home Living Family/patient expects to be discharged to:: Skilled nursing facility Living Arrangements: Alone   Type of Home: House                                  Prior Functioning/Environment Level of Independence: Independent  Comments: pt states she struggled but managed    OT Diagnosis: Generalized weakness   OT Problem List: Decreased strength;Decreased activity tolerance;Impaired balance (sitting and/or standing);Decreased knowledge of use of DME or AE   OT Treatment/Interventions: Self-care/ADL training;Patient/family education;Therapeutic exercise;DME and/or AE instruction;Balance training    OT Goals(Current goals can be found in the care plan section) Acute Rehab OT Goals Patient Stated Goal: be independent again OT Goal Formulation: With patient Time For Goal Achievement: 04/02/14 Potential to Achieve Goals: Good ADL Goals Pt Will Perform Grooming: with min guard assist;standing Pt Will Perform Lower Body Bathing: with min assist;sit to/from stand Pt Will Perform Lower Body Dressing: with mod assist;sit to/from stand Pt Will Transfer to Toilet: with min assist;ambulating;bedside commode Pt Will Perform Toileting - Clothing  Manipulation and hygiene: sit to/from stand  OT Frequency: Min 2X/week   Barriers to D/C:            Co-evaluation              End of Session    Activity Tolerance: Patient limited by fatigue Patient left: in bed;with call bell/phone within reach;with bed alarm set   Time: 6063-0160 OT Time Calculation (min): 21 min Charges:  OT General Charges $OT Visit: 1 Procedure OT Evaluation $Initial OT Evaluation Tier I: 1 Procedure OT Treatments $Self Care/Home Management : 8-22 mins G-Codes:    Rylen Swindler Mar 26, 2014, 11:53 AM Lesle Chris, OTR/L [phone] 2014-03-26

## 2014-03-19 NOTE — Progress Notes (Signed)
Physical Therapy Treatment Patient Details Name: [name] MRN: [id number] DOB: 1924-11-21 Today's Date: 03/19/2014    History of Present Illness 80 yo female adm  03/16/14 with cough, SOB and LUE swelling; Pt was found to have HCAP and LUE DVT; she had thoracentesis 03/18/14 rem,oving 1.2L of fluid; PMHx: CHF, HTN, NSCLC-s/p lobectomy in 2005    PT Comments    Progressing slowly with mobility. Continue to recommend SNF for rehab  Follow Up Recommendations  SNF     Equipment Recommendations  None recommended by PT    Recommendations for Other Services       Precautions / Restrictions Precautions Precautions: Fall Precaution Comments: L UE DVT Restrictions Weight Bearing Restrictions: No    Mobility  Bed Mobility                  Transfers Overall transfer level: Needs assistance Equipment used: Rolling walker (2 wheeled) Transfers: Sit to/from Stand Sit to Stand: Mod assist         General transfer comment: Increased time. Mod assist to rise, stabilize, control descent. 2 attempts to get to full upright position.   Ambulation/Gait Ambulation/Gait assistance: Min assist Ambulation Distance (Feet): 30 Feet Assistive device: Rolling walker (2 wheeled) Gait Pattern/deviations: Shuffle;Decreased step length - left;Decreased step length - right;Trunk flexed;Step-to pattern     General Gait Details: multi-modal cues for posture, breathing, RW technique, direction and safety. slow gait speed. Assist to stabilize throughout distance and for walker maneuvering. LE instability noted. Followed with recliner. Pt fatigues easily.    Stairs            Wheelchair Mobility    Modified Rankin (Stroke Patients Only)       Balance                                    Cognition Arousal/Alertness: Awake/alert Behavior During Therapy: WFL for tasks assessed/performed Overall Cognitive Status: Within Functional Limits for tasks assessed                      Exercises General Exercises - Lower Extremity Ankle Circles/Pumps: AROM;Both;10 reps;Seated Long Arc Quad: AROM;Both;10 reps;Seated Hip ABduction/ADduction: AROM;AAROM;Both;10 reps;Seated    General Comments        Pertinent Vitals/Pain Pain Assessment: 0-10    Home Living                      Prior Function            PT Goals (current goals can now be found in the care plan section) Progress towards PT goals: Progressing toward goals    Frequency  Min 3X/week    PT Plan Current plan remains appropriate    Co-evaluation             End of Session Equipment Utilized During Treatment: Gait belt Activity Tolerance: Patient limited by fatigue Patient left: in chair;with call bell/phone within reach;with chair alarm set;with family/visitor present     Time: 0017-4944 PT Time Calculation (min): 29 min  Charges:  $Gait Training: 8-22 mins $Therapeutic Exercise: 8-22 mins                    G Codes:      Weston Anna, MPT Pager: [phone]

## 2014-03-19 NOTE — Progress Notes (Signed)
ANTICOAGULATION CONSULT NOTE - Follow Up Consult  Pharmacy Consult for Heparin conversion to Eliquis Indication: DVT  Allergies  Allergen Reactions  . Keflex [Cephalexin] Rash  . Sulfa Antibiotics Nausea And Vomiting  . Other     Pt states antibiotics--unable to remember names of meds  . Paroxetine Hcl Anxiety  . Ramipril [Ramipril] Rash and Cough  . Sertraline Anxiety    Patient Measurements: Height: 5\' 5"  (165.1 cm) Weight: 154 lb 8 oz (70.081 kg) IBW/kg (Calculated) : 57  Vital Signs: Temp: 99.6 F (37.6 C) (08/07 0600) Temp src: Oral (08/07 0600) BP: 129/66 mmHg (08/07 0734) Pulse Rate: 89 (08/07 0734)  Labs:  Recent Labs  03/17/14 0110  03/17/14 0900 03/17/14 2205 03/18/14 0520 03/19/14 0420  HGB 9.3*  --   --   --   --   --   HCT 27.6*  --   --   --   --   --   PLT 230  --   --   --   --   --   APTT 36  --   --   --   --   --   LABPROT 14.7  --   --   --   --   --   INR 1.15  --   --   --   --   --   HEPARINUNFRC  --   --   --  0.66 0.69 0.61  CREATININE  --   < > 1.40*  --  1.59* 1.62*  < > = values in this interval not displayed.  Estimated Creatinine Clearance: 24.5 ml/min (by C-G formula based on Cr of 1.62).   Medications:  Infusions:  . heparin 1,200 Units/hr (03/19/14 0736)    Assessment: 82 yof with PMH of NSCLC s/p lobectomy in 2005 with adjuvent chemo and radiation on Tarceva since 2006, hypothyroidism, HTN, CHF. Venus duplex revealed left upper extremity DVT. Pharmacy initially consulted to dose IV Heparin, now consulted to transition patient to Eliquis.  Patient has been therapeutic on Heparin IV @ 1200units/hr No issues or bleeding per RN SCr trending up. 1.62 today. CrCl of 29ml/min. (No adjustment necessary in renal dysfunction for this indication, however, patients with SCr > 2.5 or CrCl < 62ml/min were not included in clinical trials)  Goal of Therapy:  Appropriate oral anticoagulation for treatment of DVT.   Plan:  Discontinue  Heparin infusion at 1400 Start Eliquis 10mg  BID x 7 days, then Eliquis 5mg  BID OK to start Eliquis today at time of heparin IV discontinuation Watch renal function  Kizzie Furnish, PharmD Pager: [phone] 03/19/2014 1:30 PM

## 2014-03-19 NOTE — Progress Notes (Signed)
ANTIBIOTIC CONSULT NOTE - Follow-up  Pharmacy Consult for Cefepime and Vancomycin  Indication: pneumonia  Allergies  Allergen Reactions  . Keflex [Cephalexin] Rash  . Sulfa Antibiotics Nausea And Vomiting  . Other     Pt states antibiotics--unable to remember names of meds  . Paroxetine Hcl Anxiety  . Ramipril [Ramipril] Rash and Cough  . Sertraline Anxiety    Patient Measurements: Height: 5\' 5"  (165.1 cm) Weight: 154 lb 8 oz (70.081 kg) IBW/kg (Calculated) : 57   Vital Signs: Temp: 99.6 F (37.6 C) (08/07 0600) Temp src: Oral (08/07 0600) BP: 129/66 mmHg (08/07 0734) Pulse Rate: 89 (08/07 0734) Intake/Output from previous day: 08/06 0701 - 08/07 0700 In: 1914 [P.O.:840; I.V.:393] Out: 550 [Urine:550] Intake/Output from this shift: Total I/O In: 120 [P.O.:120] Out: -   Labs:  Recent Labs  03/17/14 0110  03/17/14 0900 03/18/14 0520 03/19/14 0420  WBC 5.3  --   --   --   --   HGB 9.3*  --   --   --   --   PLT 230  --   --   --   --   CREATININE  --   < > 1.40* 1.59* 1.62*  < > = values in this interval not displayed. Estimated Creatinine Clearance: 24.5 ml/min (by C-G formula based on Cr of 1.62).  Recent Labs  03/17/14 1135  [place] 21.1*     Microbiology: Recent Results (from the past 720 hour(s))  BODY FLUID CULTURE     Status: None   Collection Time    03/18/14 12:01 PM      Result Value Ref Range Status   Specimen Description PLEURAL   Final   Special Requests Normal   Final   Gram Stain     Final   Value: NO WBC SEEN     NO SQUAMOUS EPITHELIAL CELLS SEEN     NO ORGANISMS SEEN     Performed at [facility]   Culture     Final   Value: NO GROWTH 1 DAY     Performed at [facility]   Report Status PENDING   Incomplete    Medical History: Past Medical History  Diagnosis Date  . lung ca dx'd 08/2003    chemo/xrt comp 2005; tarceva ongoing  . Hypertension   . Hypothyroidism   . Lung cancer, lower lobe 07/11/2011     Medications:  Scheduled:  . amLODipine  2.5 mg Oral Daily  . aspirin  81 mg Oral Daily  . ceFEPime (MAXIPIME) IV  1 g Intravenous Q24H  . docusate sodium  100 mg Oral Daily  . guaiFENesin  600 mg Oral BID  . ipratropium-albuterol  3 mL Nebulization Q6H  . levothyroxine  112 mcg Oral QAC breakfast  . magnesium hydroxide  30 mL Oral Once  . polyethylene glycol  17 g Oral Daily  . vancomycin  750 mg Intravenous Q24H   Infusions:  . heparin 1,200 Units/hr (03/19/14 0736)   Assessment: 13 yoF with hx non-small cell Lung Ca on Tarceva, hypothyroidism, HTN and CHF. Pt was initially at [facility] and transferred to [facility] for further workup. Chest x-ray shows bilateral basal changes suspicious for pneumonia. Pharmacy consulted to continue cefepime and vanco for HCAP.   8/4 >>cefepime >> 8/4 >>vancomycin >>   TTmax: afebrile WBCs: 5.3 (8/5) Renal: SCr 1.62 CrCl 27 (N)  8/6 pleural fluid: ngtd  Drug level / dose changes info: 8/5 1130 VT: 21.1  on Vanc 1g Q24H. Decrease to 750mg  Q24H  Goal of Therapy:  Vancomycin trough level 15-20 mcg/ml  Plan:   Continue Vancomycin 750mg  IV Q24H  Continue Cefepime 1Gm IV q24h  F/U SCR/cultures/levels  Kizzie Furnish, PharmD Pager: [phone] 03/19/2014 8:55 AM

## 2014-03-19 NOTE — Progress Notes (Signed)
[facility] PROGRESS NOTE   [name] DUK:[id number] DOB: May 17, 1925 DOA: 03/16/2014 PCP: Robyn Haber, MD  HPI/Subjective: The cytology report showed reactive cells, no evidence of malignancy. Discussed with Dr. Jana Hakim, okay to change anticoagulation to oral agent.  Assessment/Plan: Principal Problem:   HCAP (healthcare-associated pneumonia) Active Problems:   Lung cancer, lower lobe   Hypothyroidism   Hypertension   Anemia, unspecified   Septic shock   Hyponatremia   Abnormal LFTs   Fluid overload   Left arm swelling    HCAP (healthcare-associated pneumonia)  -The patient is presenting with complaints of cough, shortness of breath, hypotension requiring pressors.  -She was initially admitted at [facility] and now transferred here for further workup.  -Her chest x-ray has been reviewed and shows bilateral basal changes suspicious for pneumonia.  -She will be treated with continuation of her broad-spectrum antibiotic that she was receiving at the [facility].  -I would continue her on IV vancomycin and cefepime.   Acute DVT, involving left upper extremity -Patient presented with left upper extremity swelling, patient has PICC line in the same extremity. -Doppler ultrasound showed acute DVT in the subclavian vein. -She is on heparin drip, switched to oral anticoagulants, Coumadin versus Eliquis. -I will ask pharmacy to start Eliquis, she might not be eligible for that because of creatinine of 1.6.  Left lung non-small cell cancer  -Patient appears to have worsening effusion in the site of her prior lobectomy.  -Cytology report is not available but she did have increased lymphocytes on her pleural effusion analysis, LDH was 111.  -I will provide her with incentive spirometry and flutter valve for pulmonary toilet.   Left sided pleural effusion -Status post recent thoracentesis and removal of about 1 L of fluids per  son. -Increased lymphocytes, LDH was 111 but no cytology.  -Thoracentesis repeated, waiting on pleural fluid studies to come back.  Hyponatremia  -Volume overload with third spacing secondary to hypoalbuminemia -The patient appears to have significant volume overload with evidence of bilateral edema and hyponatremia.  -Currently have it all of her labs she may require resumption of her Lasix.  -She recently had received IV fluids resuscitation for her sepsis therefore I would recommend gentle on her diagnosis.  -Monitor ins and outs and daily weight   Abnormal LFT  -Patient does not have any abdominal tenderness  -RUQ ultrasound showed no acute abnormalities apart from cholelithiasis, no Murphy's sign. -Patient probably did have a degree of shocked liver and she was admitted for hypotension which required vasopressors.  Hypothyroidism  -Continue Synthroid   Code Status: Full code Family Communication: Plan discussed with the patient. Disposition Plan: Remains inpatient   Consultants:  Oncology  Procedures:  None  Antibiotics:  Vancomycin and cefepime   Objective: Filed Vitals:   03/19/14 0734  BP: 129/66  Pulse: 89  Temp:   Resp:     Intake/Output Summary (Last 24 hours) at 03/19/14 1307 Last data filed at 03/19/14 1234  Gross per 24 hour  Intake    873 ml  Output    400 ml  Net    473 ml   Filed Weights   03/17/14 0336 03/18/14 0659 03/19/14 0600  Weight: 72.213 kg (159 lb 3.2 oz) 71.532 kg (157 lb 11.2 oz) 70.081 kg (154 lb 8 oz)    Exam: General: Alert and awake, oriented x3, not in any acute distress. HEENT: anicteric sclera, pupils reactive to light and accommodation, EOMI CVS: S1-S2 clear, no murmur  rubs or gallops Chest: clear to auscultation bilaterally, no wheezing, rales or rhonchi Abdomen: soft nontender, nondistended, normal bowel sounds, no organomegaly Extremities: no cyanosis, clubbing or edema noted bilaterally Neuro: Cranial nerves  II-XII intact, no focal neurological deficits  Data Reviewed: Basic Metabolic Panel:  Recent Labs Lab 03/17/14 0315 03/17/14 0900 03/18/14 0520 03/19/14 0420  NA 126* 129* 127* 126*  K 5.3 5.3 5.0 5.5*  CL 96 96 95* 95*  CO2 20 23 22 19   GLUCOSE 113* 115* 127* 160*  BUN 31* 31* 33* 35*  CREATININE 1.32* 1.40* 1.59* 1.62*  CALCIUM 8.9 9.4 9.0 9.1  MG 2.1  --   --   --   PHOS 2.5  --   --   --    Liver Function Tests:  Recent Labs Lab 03/17/14 0315 03/18/14 0520 03/19/14 0420  AST 189* 85* 39*  ALT 148* 118* 84*  ALKPHOS 213* 205* 206*  BILITOT 0.5 0.4 0.7  PROT 5.4* 5.3* 5.5*  ALBUMIN 2.1* 2.1* 2.1*   No results found for this basename: LIPASE, AMYLASE,  in the last 168 hours No results found for this basename: AMMONIA,  in the last 168 hours CBC:  Recent Labs Lab 03/17/14 0110  WBC 5.3  NEUTROABS 4.2  HGB 9.3*  HCT 27.6*  MCV 82.6  PLT 230   Cardiac Enzymes: No results found for this basename: CKTOTAL, CKMB, CKMBINDEX, TROPONINI,  in the last 168 hours BNP (last 3 results)  Recent Labs  03/17/14 0105  PROBNP 2029.0*   CBG: No results found for this basename: GLUCAP,  in the last 168 hours  Micro Recent Results (from the past 240 hour(s))  BODY FLUID CULTURE     Status: None   Collection Time    03/18/14 12:01 PM      Result Value Ref Range Status   Specimen Description PLEURAL   Final   Special Requests Normal   Final   Gram Stain     Final   Value: NO WBC SEEN     NO SQUAMOUS EPITHELIAL CELLS SEEN     NO ORGANISMS SEEN     Performed at [facility]   Culture     Final   Value: NO GROWTH 1 DAY     Performed at [facility]   Report Status PENDING   Incomplete     Studies: Dg Chest 1 View  03/18/2014   CLINICAL DATA:  Lung cancer. Left pleural effusion. Status post left thoracentesis.  EXAM: CHEST - 1 VIEW  COMPARISON:  CT scan dated 03/17/2014  FINDINGS: There has been marked reduction in the large left pleural effusion  with improved aeration in the left lung. There is a moderate right effusion with secondary compressive atelectasis. Pulmonary vascularity is normal. Scarring in the left hilum.  IMPRESSION: No pneumothorax after left thoracentesis. Marked reduction in left effusion. Persistent moderate right effusion.   Electronically Signed   By: Rozetta Nunnery M.D.   On: 03/18/2014 12:32   Ct Angio Chest Pe W/cm &/or Wo Cm  03/17/2014   CLINICAL DATA:  Shortness of breath and weakness.  EXAM: CT ANGIOGRAPHY CHEST WITH CONTRAST  TECHNIQUE: Multidetector CT imaging of the chest was performed using the standard protocol during bolus administration of intravenous contrast. Multiplanar CT image reconstructions and MIPs were obtained to evaluate the vascular anatomy.  CONTRAST:  139mL OMNIPAQUE IOHEXOL 350 MG/ML SOLN  COMPARISON:  Chest x-ray 01/06/2014.  CT 03/07/2011.  FINDINGS: Thoracic aorta is  atherosclerotic. No aneurysm. No dissection. Great vessels are patent. Pulmonary arteries are normal. Coronary artery disease. Moderate pericardial effusion.  Mediastinum difficult to evaluate due to the pericardial effusion and large pleural effusions. The thoracic esophagus is slightly dilated.  Narrowing of both mainstem bronchi noted secondary to the large pleural effusions. Basilar atelectasis. Mild diffuse interstitial prominence. A component of interstitial edema cannot be excluded. Very large bilateral pleural effusions. No pneumothorax.  Thyroid region unremarkable. Shotty axillary lymph nodes. Diffuse anasarca. Prominent degenerative changes thoracic spine.  Review of the MIP images confirms the above findings.  IMPRESSION: 1. No evidence pulmonary embolus. 2. Moderate pericardial effusion. 3. Coronary artery disease. 4. Severe bilateral pleural effusions. Basilar atelectasis. Component of interstitial edema cannot be excluded. Diffuse anasarca. 5. Thoracic esophagus slightly dilated.   Electronically Signed   By: Marcello Moores  Register    On: 03/17/2014 17:27   US Thoracentesis Asp Pleural Space W/img Guide  03/18/2014   CLINICAL DATA:  Patient with remote history of lung carcinoma, dyspnea, bilateral pleural effusions. Request is made for diagnostic and therapeutic left thoracentesis.  EXAM: ULTRASOUND GUIDED DIAGNOSTIC AND THERAPEUTIC LEFT THORACENTESIS  COMPARISON:  None.  PROCEDURE: An ultrasound guided thoracentesis was thoroughly discussed with the patient and questions answered. The benefits, risks, alternatives and complications were also discussed. The patient understands and wishes to proceed with the procedure. Written consent was obtained.  Ultrasound was performed to localize and mark an adequate pocket of fluid in the left chest. The area was then prepped and draped in the normal sterile fashion. 1% Lidocaine was used for local anesthesia. Under ultrasound guidance a 19 gauge Yueh catheter was introduced. Thoracentesis was performed. The catheter was removed and a dressing applied.  Complications:  None.  FINDINGS: A total of approximately 1.2 liters of slightly turbid, amber fluid was removed. The fluid sample wassent for laboratory analysis.  IMPRESSION: Successful ultrasound guided diagnostic and therapeutic left thoracentesis yielding 1.2 liters of pleural fluid.  Read by: Rowe Robert, PA-C   Electronically Signed   By: Maryclare Bean M.D.   On: 03/18/2014 13:54    Scheduled Meds: . amLODipine  2.5 mg Oral Daily  . aspirin  81 mg Oral Daily  . ceFEPime (MAXIPIME) IV  1 g Intravenous Q24H  . docusate sodium  100 mg Oral Daily  . guaiFENesin  600 mg Oral BID  . ipratropium-albuterol  3 mL Nebulization Q6H  . levothyroxine  112 mcg Oral QAC breakfast  . polyethylene glycol  17 g Oral Daily  . vancomycin  750 mg Intravenous Q24H   Continuous Infusions: . heparin 1,200 Units/hr (03/19/14 0736)       Time spent: 35 minutes    [facility] A  [facility] Pager [phone] If 7PM-7AM, please contact  night-coverage at [email or web address], password [facility] 03/19/2014, 1:07 PM  LOS: 3 days

## 2014-03-19 NOTE — Progress Notes (Signed)
Clinical Social Work Department BRIEF PSYCHOSOCIAL ASSESSMENT 03/19/2014  Patient:  [name], [name]     Account Number:  [id number]     Admit date:  03/16/2014  Clinical Social Worker:  Renold Genta  Date/Time:  03/19/2014 12:16 PM  Referred by:  Physician  Date Referred:  03/19/2014 Referred for  SNF Placement   Other Referral:   Interview type:  Patient Other interview type:   and daughter-in-law, [name] at bedside    PSYCHOSOCIAL DATA Living Status:  ALONE Admitted from facility:   Level of care:   Primary support name:  [name], [name]. (son) c#: [phone] Primary support relationship to patient:  CHILD, ADULT Degree of support available:   good    CURRENT CONCERNS Current Concerns  Post-Acute Placement   Other Concerns:    SOCIAL WORK ASSESSMENT / PLAN CSW received consult for SNF placement, reviewed PT evaluation recommending SNF at discharge as well.   Assessment/plan status:  Information/Referral to [employer] Other assessment/ plan:   Information/referral to community resources:   CSW completed FL2 and faxed information out to [facility] - provided SNF list and bed offers.    PATIENT'S/FAMILY'S RESPONSE TO PLAN OF CARE: Patient's son was requesting [facility], though [place] spoke with [facility] and due to the expense of Tarveca (oral chemo medication that she takes every other day) they were unable to offer a bed. Patient's daughter-in-law states that [facility] be closer to their house though as they [place] [address].       [name], [facility] Clinical Social Worker cell #: [phone]

## 2014-03-20 DIAGNOSIS — J90 Pleural effusion, not elsewhere classified: Secondary | ICD-10-CM

## 2014-03-20 LAB — COMPREHENSIVE METABOLIC PANEL
ALT: 60 U/L — AB (ref 0–35)
AST: 26 U/L (ref 0–37)
Albumin: 2.1 g/dL — ABNORMAL LOW (ref 3.5–5.2)
Alkaline Phosphatase: 192 U/L — ABNORMAL HIGH (ref 39–117)
Anion gap: 12 (ref 5–15)
BUN: 39 mg/dL — ABNORMAL HIGH (ref 6–23)
CALCIUM: 9.3 mg/dL (ref 8.4–10.5)
CO2: 19 meq/L (ref 19–32)
CREATININE: 1.72 mg/dL — AB (ref 0.50–1.10)
Chloride: 94 mEq/L — ABNORMAL LOW (ref 96–112)
GFR calc Af Amer: 30 mL/min — ABNORMAL LOW (ref >90)
GFR, EST NON AFRICAN AMERICAN: 26 mL/min — AB (ref >90)
GLUCOSE: 117 mg/dL — AB (ref 70–99)
Potassium: 5.7 mEq/L — ABNORMAL HIGH (ref 3.7–5.3)
SODIUM: 125 meq/L — AB (ref 137–147)
TOTAL PROTEIN: 5.6 g/dL — AB (ref 6.0–8.3)
Total Bilirubin: 0.5 mg/dL (ref 0.3–1.2)

## 2014-03-20 MED ORDER — FUROSEMIDE 10 MG/ML IJ SOLN
40.0000 mg | Freq: Once | INTRAMUSCULAR | Status: AC
  Administered 2014-03-20: 40 mg via INTRAVENOUS
  Filled 2014-03-20: qty 4

## 2014-03-20 NOTE — Progress Notes (Signed)
PULMONARY / CRITICAL CARE MEDICINE   Name: [name] MRN: [id number] DOB: 19-Oct-1924    ADMISSION DATE:  03/16/2014 CONSULTATION DATE:  03/20/14   REFERRING MD :  Kizzie Furnish  CHIEF COMPLAINT:  sob  INITIAL PRESENTATION:  88 yowf s/p status post left lower lobectomy in March 2005 for a 3.2 cm, grade 2 lung adenocarcinoma with multiple positive lymph nodes followed by Dr Jana Hakim with bilateral pleural effusions alerady tapped on L on 8/6 with some improvement and PCCM service asked to evaluate am 8/8   STUDIES:  Echo 8/6 Hyperdynamic LV function, LVEF 93%, diastolic dysfunction, moderate TR with mild to moderate pulmonary hypertension, small cirumferential pericardial effusion with left pleural effusion.     SIGNIFICANT EVENTS: 8/6  L thoracentesis =  x 1.1 liters transudative   HISTORY OF PRESENT ILLNESS:  80 y.o. female with Past medical history of non-small cell lung cancer status post lobectomy in 2005 with adjuvant chemotherapy and radiation on Tarceva at present since 2006, hypothyroidism, hypertension, CHF. She has chronic mild to moderate size pleural effusion on the left. She was seen here in May 15 at which time she had mild shortness of breath with exertion which was stable. Since last few weeks she has been having progressively worsening shortness of breath along with cough. She also had some sputum production. She was started on antibiotic levofloxacin in the end of July 15. A chest x-ray was performed which showed moderate pleural effusion and she had a thoracentesis with 1.1 L removed. After the procedure when she was at home she had a fall with hypotension per EMS. On further questioning she mentioned that she had similar falls when she stumbled on things twice in that week.  She was initially seen at [place].  Initially she was admitted ICU. She was given multiple IV bolus normal saline. Initially a centerline and later on left-sided  PICC line was placed. She was also initially given phenylephrine for her shock.  A chest x-ray was performed which was showing reaccumulation of pleural fluid. Patient was covered with broad-spectrum antibiotics vancomycin and cefepime.  But still continued to have cough with yellow-green expectoration, shortness of breath, significant fatigue and generalized weakness and  swelling of both legs.  Denies cough at present and no cp with deep breath. Onset of sob was gradual/ progressive  No obvious day to day or daytime variabilty or assoc   chest tightness, subjective wheeze overt sinus or hb symptoms. No unusual exp hx or h/o childhood pna/ asthma or knowledge of premature birth.  Sleeping ok without nocturnal  or early am exacerbation  of respiratory  c/o's or need for noct saba. Also denies any obvious fluctuation of symptoms with weather or environmental changes or other aggravating or alleviating factors except as outlined above   Current Medications, Allergies, Complete Past Medical History, Past Surgical History, Family History, and Social History were reviewed in [employer] record.  ROS  The following are not active complaints unless bolded sore throat, dysphagia, dental problems, itching, sneezing,  nasal congestion or excess/ purulent secretions, ear ache,   fever, chills, sweats, unintended wt loss, pleuritic or exertional cp, hemoptysis,  orthopnea pnd or leg swelling, presyncope, palpitations, heartburn, abdominal pain, anorexia, nausea, vomiting, diarrhea  or change in bowel or urinary habits, change in stools or urine, dysuria,hematuria,  rash, arthralgias, visual complaints, headache, numbness weakness or ataxia or problems with walking or coordination,  change in mood/affect or memory.  PAST MEDICAL HISTORY :  Past Medical History  Diagnosis Date  . lung ca dx'd 08/2003    chemo/xrt comp 2005; tarceva ongoing  . Hypertension   . Hypothyroidism   .  Lung cancer, lower lobe 07/11/2011   History reviewed. No pertinent past surgical history. Prior to Admission medications   Medication Sig Start Date End Date Taking? Authorizing Provider  amLODipine (NORVASC) 5 MG tablet Take 2.5 mg by mouth Daily.  05/01/11  Yes Historical Provider, MD  aspirin 81 MG tablet Take 81 mg by mouth daily.     Yes Historical Provider, MD  Calcium Carbonate-Vitamin D (CALCIUM + D PO) Take 1 tablet by mouth daily.    Yes Historical Provider, MD  erlotinib (TARCEVA) 100 MG tablet Take 1 tablet (100 mg total) by mouth every other day. 10/27/13  Yes Deatra Robinson, MD  ferrous gluconate (FERGON) 324 MG tablet Take 324 mg by mouth daily with breakfast.   Yes Historical Provider, MD  furosemide (LASIX) 20 MG tablet Take 20 mg by mouth daily.  11/08/11  Yes Historical Provider, MD  ibuprofen (ADVIL,MOTRIN) 200 MG tablet Take 200 mg by mouth every 6 (six) hours as needed.   Yes Historical Provider, MD  levofloxacin (LEVAQUIN) 500 MG tablet Take 1 tablet by mouth daily. For 10 days 03/08/14  Yes Historical Provider, MD  levothyroxine (SYNTHROID, LEVOTHROID) 112 MCG tablet Take 112 mcg by mouth daily before breakfast.   Yes Historical Provider, MD  loratadine (CLARITIN) 10 MG tablet Take 10 mg by mouth daily.     Yes Historical Provider, MD  Magnesium 100 MG CAPS Take 1 capsule by mouth daily.   Yes Historical Provider, MD  meclizine (ANTIVERT) 12.5 MG tablet Take 1 tablet (12.5 mg total) by mouth 3 (three) times daily as needed for dizziness. 05/19/13  Yes Amy Milda Smart, PA-C  metroNIDAZOLE (METROCREAM) 0.75 % cream Apply 1 application topically 2 (two) times daily as needed (rosacea).  05/07/13  Yes Historical Provider, MD  polyethylene glycol (MIRALAX / GLYCOLAX) packet Take 17 g by mouth daily as needed for mild constipation.    Yes Historical Provider, MD  vitamin B-12 (CYANOCOBALAMIN) 1000 MCG tablet Take 1,000 mcg by mouth daily.     Yes Historical Provider, MD  vitamin C  (ASCORBIC ACID) 500 MG tablet Take 500 mg by mouth daily.   Yes Historical Provider, MD  zolpidem (AMBIEN) 10 MG tablet Take 2.5-5 mg by mouth at bedtime as needed for sleep.   Yes Historical Provider, MD   Allergies  Allergen Reactions  . Keflex [Cephalexin] Rash  . Sulfa Antibiotics Nausea And Vomiting  . Other     Pt states antibiotics--unable to remember names of meds  . Paroxetine Hcl Anxiety  . Ramipril [Ramipril] Rash and Cough  . Sertraline Anxiety    FAMILY HISTORY:  History reviewed. No pertinent family history. SOCIAL HISTORY:  reports that she has never smoked. She has never used smokeless tobacco. She reports that she does not drink alcohol or use illicit drugs.     SUBJECTIVE:  Comfortable RA 45 degrees HOB  VITAL SIGNS: Temp:  [97.7 F (36.5 C)-99.1 F (37.3 C)] 98.5 F (36.9 C) (08/08 0542) Pulse Rate:  [95-104] 95 (08/08 0542) Resp:  [19-20] 19 (08/08 0542) BP: (108-115)/(54-64) 112/54 mmHg (08/08 0542) SpO2:  [93 %-98 %] 94 % (08/08 0542) Weight:  [158 lb 3.2 oz (71.759 kg)] 158 lb 3.2 oz (71.759 kg) (08/08 0542) FIO2  RA  HEMODYNAMICS:  VENTILATOR SETTINGS:   INTAKE / OUTPUT:  Intake/Output Summary (Last 24 hours) at 03/20/14 1143 Last data filed at 03/20/14 0900  Gross per 24 hour  Intake    966 ml  Output    450 ml  Net    516 ml    PHYSICAL EXAMINATION: Elderly chronically ill wf nad  Pt alert, approp nad  No jvd Oropharanx clear Neck supple Lungs with a few scattered exp > insp rhonchi bilaterally RRR no s3 or or sign murmur Abd obese with excursion >> Extr wam with no   clubbing noted - 2 plus sym pitting  Neuro  No motor deficits  LABS:  CBC  Recent Labs Lab 03/17/14 0110  WBC 5.3  HGB 9.3*  HCT 27.6*  PLT 230   Coag's  Recent Labs Lab 03/17/14 0110  APTT 36  INR 1.15   BMET  Recent Labs Lab 03/18/14 0520 03/19/14 0420 03/20/14 0500  NA 127* 126* 125*  K 5.0 5.5* 5.7*  CL 95* 95* 94*  CO2 22 19 19    BUN 33* 35* 39*  CREATININE 1.59* 1.62* 1.72*  GLUCOSE 127* 160* 117*   Electrolytes  Recent Labs Lab 03/17/14 0315  03/18/14 0520 03/19/14 0420 03/20/14 0500  CALCIUM 8.9  < > 9.0 9.1 9.3  MG 2.1  --   --   --   --   PHOS 2.5  --   --   --   --   < > = values in this interval not displayed. Sepsis Markers No results found for this basename: LATICACIDVEN, PROCALCITON, O2SATVEN,  in the last 168 hours ABG No results found for this basename: PHART, PCO2ART, PO2ART,  in the last 168 hours Liver Enzymes  Recent Labs Lab 03/18/14 0520 03/19/14 0420 03/20/14 0500  AST 85* 39* 26  ALT 118* 84* 60*  ALKPHOS 205* 206* 192*  BILITOT 0.4 0.7 0.5  ALBUMIN 2.1* 2.1* 2.1*   Cardiac Enzymes  Recent Labs Lab 03/17/14 0105  PROBNP 2029.0*   Glucose No results found for this basename: GLUCAP,  in the last 168 hours  Imaging No results found.   ASSESSMENT / PLAN:  1)  Acute on chronic bilateral effusions/ transudate on L with low alb and diast chf/ CRI Rec:  Avoid nsaids/ norvarsc  Rx With BB / diuretics to lower systemic and pulmonary venous pressures and try to build up alb by nutrition (no indication for IV alb here).  If effusions become symptomatic again despite above could consider pleurex vs shift completely to comfort care noting she is dnr status   2) CRI/ hyponatremia with hyperkalemia Very unlikely addisonian in setting of pitting edema so rx with lasix and water restriction plus recheck TSH / am cortisol to be complete   Christinia Gully, MD Pulmonary and [place] [phone] After 5:30 PM or weekends, call [phone]      P[facility] and [place] Pager: [phone]  03/20/2014, 11:43 AM

## 2014-03-20 NOTE — Progress Notes (Signed)
[facility] PROGRESS NOTE   [name] RJJ:[id number] DOB: [birth year] DOA: 03/16/2014 PCP: Robyn Haber, MD  HPI/Subjective: Anticoagulation switched to Eliquis, patient feels better. Breathing is better than yesterday.   Assessment/Plan: Principal Problem:   HCAP (healthcare-associated pneumonia) Active Problems:   Lung cancer, lower lobe   Hypothyroidism   Hypertension   Anemia, unspecified   Septic shock   Hyponatremia   Abnormal LFTs   Fluid overload   Left arm swelling    HCAP (healthcare-associated pneumonia)  -The patient is presenting with complaints of cough, shortness of breath, hypotension requiring pressors.  -She was [facility] and now transferred here for further workup.  -Her chest x-ray has been reviewed and shows bilateral basal changes suspicious for pneumonia.  -She will be treated with continuation of her broad-spectrum antibiotic that she was receiving at the [facility].  -I would continue her on IV vancomycin and cefepime.   Acute DVT, involving left upper extremity -Patient presented with left upper extremity swelling, patient has PICC line in the same extremity. -Doppler ultrasound showed acute DVT in the subclavian vein. -She was on heparin drip, switched to Eliquis.  Left lung non-small cell cancer  -Patient appears to have worsening effusion in the site of her prior lobectomy.  -Cytology report is not available but she did have increased lymphocytes on her pleural effusion analysis, LDH was 111.  -I will provide her with incentive spirometry and flutter valve for pulmonary toilet.   Left sided pleural effusion -Status post recent thoracentesis and removal of about 1 L of fluids per son. -Increased lymphocytes, LDH was 111 but no cytology. Cytology showed reactive mesothelial cell with acute inflammation. -Per oncology she might need another thoracentesis in the long run or even  Pleurx catheter.  Hyponatremia  -Volume overload with third spacing secondary to hypoalbuminemia -The patient appears to have significant volume overload with evidence of bilateral edema and hyponatremia.  -Currently have it all of her labs she may require resumption of her Lasix.  -She recently had received IV fluids resuscitation for her sepsis therefore I would recommend gentle on her diagnosis.  -Monitor ins and outs and daily weight   Abnormal LFT  -Patient does not have any abdominal tenderness  -RUQ ultrasound showed no acute abnormalities apart from cholelithiasis, no Murphy's sign. -Patient probably did have a degree of shocked liver and she was admitted for hypotension which required vasopressors.  Hypothyroidism  -Continue Synthroid   Code Status: Full code Family Communication: Plan discussed with the patient. Disposition Plan: Remains inpatient   Consultants:  Oncology  Procedures:  None  Antibiotics:  Vancomycin and cefepime   Objective: Filed Vitals:   03/20/14 0542  BP: 112/54  Pulse: 95  Temp: 98.5 F (36.9 C)  Resp: 19    Intake/Output Summary (Last 24 hours) at 03/20/14 1204 Last data filed at 03/20/14 0900  Gross per 24 hour  Intake    966 ml  Output    450 ml  Net    516 ml   Filed Weights   03/18/14 0659 03/19/14 0600 03/20/14 0542  Weight: 71.532 kg (157 lb 11.2 oz) 70.081 kg (154 lb 8 oz) 71.759 kg (158 lb 3.2 oz)    Exam: General: Alert and awake, oriented x3, not in any acute distress. HEENT: anicteric sclera, pupils reactive to light and accommodation, EOMI CVS: S1-S2 clear, no murmur rubs or gallops Chest: clear to auscultation bilaterally, no wheezing, rales or rhonchi Abdomen: soft nontender,  nondistended, normal bowel sounds, no organomegaly Extremities: no cyanosis, clubbing or edema noted bilaterally Neuro: Cranial nerves II-XII intact, no focal neurological deficits  Data Reviewed: Basic Metabolic Panel:  Recent  Labs Lab 03/17/14 0315 03/17/14 0900 03/18/14 0520 03/19/14 0420 03/20/14 0500  NA 126* 129* 127* 126* 125*  K 5.3 5.3 5.0 5.5* 5.7*  CL 96 96 95* 95* 94*  CO2 20 23 22 19 19   GLUCOSE 113* 115* 127* 160* 117*  BUN 31* 31* 33* 35* 39*  CREATININE 1.32* 1.40* 1.59* 1.62* 1.72*  CALCIUM 8.9 9.4 9.0 9.1 9.3  MG 2.1  --   --   --   --   PHOS 2.5  --   --   --   --    Liver Function Tests:  Recent Labs Lab 03/17/14 0315 03/18/14 0520 03/19/14 0420 03/20/14 0500  AST 189* 85* 39* 26  ALT 148* 118* 84* 60*  ALKPHOS 213* 205* 206* 192*  BILITOT 0.5 0.4 0.7 0.5  PROT 5.4* 5.3* 5.5* 5.6*  ALBUMIN 2.1* 2.1* 2.1* 2.1*   No results found for this basename: LIPASE, AMYLASE,  in the last 168 hours No results found for this basename: AMMONIA,  in the last 168 hours CBC:  Recent Labs Lab 03/17/14 0110  WBC 5.3  NEUTROABS 4.2  HGB 9.3*  HCT 27.6*  MCV 82.6  PLT 230   Cardiac Enzymes: No results found for this basename: CKTOTAL, CKMB, CKMBINDEX, TROPONINI,  in the last 168 hours BNP (last 3 results)  Recent Labs  03/17/14 0105  PROBNP 2029.0*   CBG: No results found for this basename: GLUCAP,  in the last 168 hours  Micro Recent Results (from the past 240 hour(s))  BODY FLUID CULTURE     Status: None   Collection Time    03/18/14 12:01 PM      Result Value Ref Range Status   Specimen Description PLEURAL   Final   Special Requests Normal   Final   Gram Stain     Final   Value: NO WBC SEEN     NO SQUAMOUS EPITHELIAL CELLS SEEN     NO ORGANISMS SEEN     Performed at [facility]   Culture     Final   Value: NO GROWTH 1 DAY     Performed at [facility]   Report Status PENDING   Incomplete     Studies: Dg Chest 1 View  03/18/2014   CLINICAL DATA:  Lung cancer. Left pleural effusion. Status post left thoracentesis.  EXAM: CHEST - 1 VIEW  COMPARISON:  CT scan dated 03/17/2014  FINDINGS: There has been marked reduction in the large left pleural  effusion with improved aeration in the left lung. There is a moderate right effusion with secondary compressive atelectasis. Pulmonary vascularity is normal. Scarring in the left hilum.  IMPRESSION: No pneumothorax after left thoracentesis. Marked reduction in left effusion. Persistent moderate right effusion.   Electronically Signed   By: Rozetta Nunnery M.D.   On: 03/18/2014 12:32   US Thoracentesis Asp Pleural Space W/img Guide  03/18/2014   CLINICAL DATA:  Patient with remote history of lung carcinoma, dyspnea, bilateral pleural effusions. Request is made for diagnostic and therapeutic left thoracentesis.  EXAM: ULTRASOUND GUIDED DIAGNOSTIC AND THERAPEUTIC LEFT THORACENTESIS  COMPARISON:  None.  PROCEDURE: An ultrasound guided thoracentesis was thoroughly discussed with the patient and questions answered. The benefits, risks, alternatives and complications were also discussed. The patient understands and  wishes to proceed with the procedure. Written consent was obtained.  Ultrasound was performed to localize and mark an adequate pocket of fluid in the left chest. The area was then prepped and draped in the normal sterile fashion. 1% Lidocaine was used for local anesthesia. Under ultrasound guidance a 19 gauge Yueh catheter was introduced. Thoracentesis was performed. The catheter was removed and a dressing applied.  Complications:  None.  FINDINGS: A total of approximately 1.2 liters of slightly turbid, amber fluid was removed. The fluid sample wassent for laboratory analysis.  IMPRESSION: Successful ultrasound guided diagnostic and therapeutic left thoracentesis yielding 1.2 liters of pleural fluid.  Read by: Rowe Robert, PA-C   Electronically Signed   By: Maryclare Bean M.D.   On: 03/18/2014 13:54    Scheduled Meds: . apixaban  10 mg Oral BID  . [START ON 03/26/2014] apixaban  5 mg Oral BID  . aspirin  81 mg Oral Daily  . ceFEPime (MAXIPIME) IV  1 g Intravenous Q24H  . docusate sodium  100 mg Oral Daily  .  furosemide  40 mg Intravenous Once  . guaiFENesin  600 mg Oral BID  . ipratropium-albuterol  3 mL Nebulization TID  . levothyroxine  112 mcg Oral QAC breakfast  . polyethylene glycol  17 g Oral Daily  . vancomycin  750 mg Intravenous Q24H   Continuous Infusions:       Time spent: 35 minutes    [facility]  [facility] Pager [phone] If 7PM-7AM, please contact night-coverage at [email or web address], password [facility] 03/20/2014, 12:04 PM  LOS: 4 days

## 2014-03-21 DIAGNOSIS — E8779 Other fluid overload: Secondary | ICD-10-CM

## 2014-03-21 DIAGNOSIS — E038 Other specified hypothyroidism: Secondary | ICD-10-CM

## 2014-03-21 LAB — BODY FLUID CULTURE
CULTURE: NO GROWTH
Gram Stain: NONE SEEN
SPECIAL REQUESTS: NORMAL

## 2014-03-21 LAB — COMPREHENSIVE METABOLIC PANEL
ALBUMIN: 2 g/dL — AB (ref 3.5–5.2)
ALK PHOS: 204 U/L — AB (ref 39–117)
ALT: 80 U/L — AB (ref 0–35)
AST: 80 U/L — ABNORMAL HIGH (ref 0–37)
Anion gap: 12 (ref 5–15)
BILIRUBIN TOTAL: 0.4 mg/dL (ref 0.3–1.2)
BUN: 41 mg/dL — AB (ref 6–23)
CHLORIDE: 93 meq/L — AB (ref 96–112)
CO2: 21 mEq/L (ref 19–32)
Calcium: 9.3 mg/dL (ref 8.4–10.5)
Creatinine, Ser: 1.77 mg/dL — ABNORMAL HIGH (ref 0.50–1.10)
GFR calc Af Amer: 29 mL/min — ABNORMAL LOW (ref >90)
GFR calc non Af Amer: 25 mL/min — ABNORMAL LOW (ref >90)
Glucose, Bld: 117 mg/dL — ABNORMAL HIGH (ref 70–99)
POTASSIUM: 5.4 meq/L — AB (ref 3.7–5.3)
SODIUM: 126 meq/L — AB (ref 137–147)
TOTAL PROTEIN: 5.7 g/dL — AB (ref 6.0–8.3)

## 2014-03-21 LAB — TSH: TSH: 12.4 u[IU]/mL — ABNORMAL HIGH (ref 0.350–4.500)

## 2014-03-21 LAB — VANCOMYCIN, TROUGH: VANCOMYCIN TR: 26.8 ug/mL — AB (ref 10.0–20.0)

## 2014-03-21 LAB — PRO B NATRIURETIC PEPTIDE: Pro B Natriuretic peptide (BNP): 3211 pg/mL — ABNORMAL HIGH (ref 0–450)

## 2014-03-21 LAB — CORTISOL: CORTISOL PLASMA: 24.2 ug/dL

## 2014-03-21 MED ORDER — LEVOTHYROXINE SODIUM 175 MCG PO TABS
175.0000 ug | ORAL_TABLET | Freq: Every day | ORAL | Status: DC
  Administered 2014-03-22 – 2014-03-23 (×2): 175 ug via ORAL
  Filled 2014-03-21 (×3): qty 1

## 2014-03-21 MED ORDER — BISOPROLOL FUMARATE 5 MG PO TABS
2.5000 mg | ORAL_TABLET | Freq: Every day | ORAL | Status: DC
  Administered 2014-03-21 – 2014-03-23 (×3): 2.5 mg via ORAL
  Filled 2014-03-21 (×3): qty 0.5

## 2014-03-21 MED ORDER — SODIUM POLYSTYRENE SULFONATE 15 GM/60ML PO SUSP
15.0000 g | Freq: Once | ORAL | Status: AC
  Administered 2014-03-21: 15 g via ORAL
  Filled 2014-03-21: qty 60

## 2014-03-21 MED ORDER — FUROSEMIDE 10 MG/ML IJ SOLN
40.0000 mg | Freq: Once | INTRAMUSCULAR | Status: AC
  Administered 2014-03-21: 40 mg via INTRAVENOUS
  Filled 2014-03-21: qty 4

## 2014-03-21 MED ORDER — LEVOTHYROXINE SODIUM 150 MCG PO TABS
150.0000 ug | ORAL_TABLET | Freq: Every day | ORAL | Status: DC
  Filled 2014-03-21: qty 1

## 2014-03-21 NOTE — Progress Notes (Signed)
Brief Pharmacy Consult Note - vancomycin follow up  Labs: vanc trough 26.8  A/P: As stated on previous note earlier today, a trough was checked due to rising SCr over the last few days and the vanc dose was d/c'd for now pending vanc level. Trough currently supratherapeutic (goal 15-20) with last dose around 1pm yesterday. Will continue to hold vanc and recheck another level tomorrow am to see if patient is clearing vanc further.   Adrian Saran, PharmD, BCPS Pager [phone] 03/21/2014 1:12 PM

## 2014-03-21 NOTE — Progress Notes (Signed)
ANTIBIOTIC CONSULT NOTE - Follow-up  Pharmacy Consult for Cefepime and Vancomycin  Indication: pneumonia  Allergies  Allergen Reactions  . Keflex [Cephalexin] Rash  . Sulfa Antibiotics Nausea And Vomiting  . Other     Pt states antibiotics--unable to remember names of meds  . Paroxetine Hcl Anxiety  . Ramipril [Ramipril] Rash and Cough  . Sertraline Anxiety    Patient Measurements: Height: 5\' 5"  (165.1 cm) Weight: 157 lb (71.215 kg) IBW/kg (Calculated) : 57   Vital Signs: Temp: 98.1 F (36.7 C) (08/09 0448) Temp src: Oral (08/09 0448) BP: 129/80 mmHg (08/09 0448) Pulse Rate: 99 (08/09 0448) Intake/Output from previous day: 08/08 0701 - 08/09 0700 In: 680 [P.O.:480; IV Piggyback:200] Out: 1150 [Urine:1150] Intake/Output from this shift:    Labs:  Recent Labs  03/19/14 0420 03/20/14 0500 03/21/14 0410  CREATININE 1.62* 1.72* 1.77*   Estimated Creatinine Clearance: 22.6 ml/min (by C-G formula based on Cr of 1.77). No results found for this basename: VANCOTROUGH, Corlis Leak, VANCORANDOM, [place], GENTPEAK, GENTRANDOM, TOBRATROUGH, TOBRAPEAK, TOBRARND, AMIKACINPEAK, AMIKACINTROU, AMIKACIN,  in the last 72 hours   Microbiology: Recent Results (from the past 720 hour(s))  BODY FLUID CULTURE     Status: None   Collection Time    03/18/14 12:01 PM      Result Value Ref Range Status   Specimen Description PLEURAL   Final   Special Requests Normal   Final   Gram Stain     Final   Value: NO WBC SEEN     NO SQUAMOUS EPITHELIAL CELLS SEEN     NO ORGANISMS SEEN     Performed at [facility]   Culture     Final   Value: NO GROWTH 2 DAYS     Performed at [facility]   Report Status PENDING   Incomplete    Medical History: Past Medical History  Diagnosis Date  . lung ca dx'd 08/2003    chemo/xrt comp 2005; tarceva ongoing  . Hypertension   . Hypothyroidism   . Lung cancer, lower lobe 07/11/2011    Medications:  Scheduled:  . apixaban  10  mg Oral BID  . [START ON 03/26/2014] apixaban  5 mg Oral BID  . aspirin  81 mg Oral Daily  . ceFEPime (MAXIPIME) IV  1 g Intravenous Q24H  . docusate sodium  100 mg Oral Daily  . guaiFENesin  600 mg Oral BID  . ipratropium-albuterol  3 mL Nebulization TID  . levothyroxine  112 mcg Oral QAC breakfast  . polyethylene glycol  17 g Oral Daily  . sodium polystyrene  15 g Oral Once   Infusions:    Assessment: 40 yoF with hx non-small cell Lung Ca on Tarceva, hypothyroidism, HTN and CHF. Pt was [facility] and transferred to [facility] for further workup. Chest x-ray shows bilateral basal changes suspicious for pneumonia. Pharmacy consulted to continue cefepime and vanco for HCAP.   8/4 >>cefepime >> 8/4 >>vancomycin >>   TTmax: afebrile WBCs: 5.3 (8/5) Renal: SCr 1.77, continues to rise with est CrCl 22.6  8/6 pleural fluid: ngtd  Drug level / dose changes info: 8/5 1130 VT: 21.1 on Vanc 1g Q24H. Decrease to 750mg  Q24H  Goal of Therapy:  Vancomycin trough level 15-20 mcg/ml  Plan:  1) Due to continued rise in SCr, will check another vanc trough this morning which will be prior to 4th dose of 750mg  q24 dosing 2) Went ahead and d/c'd current vanc order -  will reorder/adjust dosing pending trough result 3) Continue cefepime 1g q24   Adrian Saran, PharmD, BCPS Pager [phone] 03/21/2014 8:46 AM

## 2014-03-21 NOTE — Progress Notes (Signed)
PULMONARY / CRITICAL CARE MEDICINE   Name: [name] MRN: [id number] DOB: 1924/11/07    ADMISSION DATE:  03/16/2014 CONSULTATION DATE:  03/20/14   REFERRING MD :  Kizzie Furnish  CHIEF COMPLAINT:  sob  INITIAL PRESENTATION:  88 yowf s/p status post left lower lobectomy in March 2005 for a 3.2 cm, grade 2 lung adenocarcinoma with multiple positive lymph nodes followed by Dr Jana Hakim with bilateral pleural effusions alerady tapped on L late July outside [place] and then again at [facility]  8/6 with some improvement in sob but bp low and PCCM service asked to evaluate am 8/8   STUDIES:  Echo 8/6 Hyperdynamic LV function, LVEF 74%, diastolic dysfunction, moderate TR with mild to moderate pulmonary hypertension, small cirumferential pericardial effusion with left pleural effusion.     SIGNIFICANT EVENTS: 8/6  L thoracentesis =  x 1.1 liters features borderline exudative/ inflammatory on cyt         SUBJECTIVE:  Comfortable RA 45 degrees HOB  VITAL SIGNS: Temp:  [98.1 F (36.7 C)-98.5 F (36.9 C)] 98.1 F (36.7 C) (08/09 0448) Pulse Rate:  [94-99] 99 (08/09 0448) Resp:  [20-22] 20 (08/09 0448) BP: (125-129)/(54-80) 129/80 mmHg (08/09 0448) SpO2:  [94 %-97 %] 96 % (08/09 0448) Weight:  [157 lb (71.215 kg)] 157 lb (71.215 kg) (08/09 0512) FIO2  RA  HEMODYNAMICS:   VENTILATOR SETTINGS:   INTAKE / OUTPUT:  Intake/Output Summary (Last 24 hours) at 03/21/14 0926 Last data filed at 03/20/14 2320  Gross per 24 hour  Intake    440 ml  Output   1000 ml  Net   -560 ml    PHYSICAL EXAMINATION: Elderly chronically ill wf nad Pt alert, approp nad  No jvd Oropharanx clear Neck supple Lungs with a few scattered exp > insp rhonchi bilaterally RRR no s3 or or sign murmur Abd obese with excursion >> Extr wam with no   clubbing noted - 2 plus sym pitting  Neuro  No motor deficits  LABS:  Lab Results  Component Value Date   TSH 12.400* 03/21/2014    Lab Results  Component  Value Date   PROBNP 3211.0* 03/21/2014      CBC  Recent Labs Lab 03/17/14 0110  WBC 5.3  HGB 9.3*  HCT 27.6*  PLT 230   Coag's  Recent Labs Lab 03/17/14 0110  APTT 36  INR 1.15   BMET  Recent Labs Lab 03/19/14 0420 03/20/14 0500 03/21/14 0410  NA 126* 125* 126*  K 5.5* 5.7* 5.4*  CL 95* 94* 93*  CO2 19 19 21   BUN 35* 39* 41*  CREATININE 1.62* 1.72* 1.77*  GLUCOSE 160* 117* 117*   Electrolytes  Recent Labs Lab 03/17/14 0315  03/19/14 0420 03/20/14 0500 03/21/14 0410  CALCIUM 8.9  < > 9.1 9.3 9.3  MG 2.1  --   --   --   --   PHOS 2.5  --   --   --   --   < > = values in this interval not displayed. Sepsis Markers No results found for this basename: LATICACIDVEN, PROCALCITON, O2SATVEN,  in the last 168 hours ABG No results found for this basename: PHART, PCO2ART, PO2ART,  in the last 168 hours Liver Enzymes  Recent Labs Lab 03/19/14 0420 03/20/14 0500 03/21/14 0410  AST 39* 26 80*  ALT 84* 60* 80*  ALKPHOS 206* 192* 204*  BILITOT 0.7 0.5 0.4  ALBUMIN 2.1* 2.1* 2.0*   Cardiac Enzymes  Recent Labs Lab 03/17/14 0105 03/21/14 0410  PROBNP 2029.0* 3211.0*   Glucose No results found for this basename: GLUCAP,  in the last 168 hours  Imaging No results found.   ASSESSMENT / PLAN:  1)  Acute on chronic bilateral effusions/ borderline exudative on L with low serum alb and diast chf/ CRI Rec:  Avoid nsaids/ norvarsc  Rx With BB / diuretics to lower systemic and pulmonary venous pressures and try to build up alb by nutrition (no indication for IV alb here).  rx diastolic chf with low dose BB   If effusions become symptomatic again despite above could consider pleurex vs shift completely to comfort care noting she is dnr status   2) CRI/ hyponatremia with hyperkalemia Very unlikely addisonian in setting of pitting edema so rx with lasix and water restriction plus recheck am cortisol to be complete  3) Hypothryroidism not on adequate rx rec  increase synthroid 8/9 am and recheck in 4 weeks    Christinia Gully, MD Pulmonary and [place] Cell [phone] After 5:30 PM or weekends, call [phone]      [facility] and [place] Pager: [phone]  03/21/2014, 9:26 AM

## 2014-03-21 NOTE — Progress Notes (Addendum)
[facility] PROGRESS NOTE   [name] BPZ:[id number] DOB: 08/08/1925 DOA: 03/16/2014 PCP: Robyn Haber, MD  HPI/Subjective: Patient feels okay she is sitting at bedside, denies any shortness of breath, denies any pain. Appreciate Dr. Gustavus Bryant help. Likely to nursing home in a.m. Per pharmacy Eliquis should be avoided with the current renal function. I will discuss with oncology to see their agent of choice  Assessment/Plan: Principal Problem:   HCAP (healthcare-associated pneumonia) Active Problems:   Lung cancer, lower lobe   Hypothyroidism   Hypertension   Anemia, unspecified   Septic shock   Hyponatremia   Abnormal LFTs   Fluid overload   Left arm swelling   Pleural effusion, bilateral    HCAP (healthcare-associated pneumonia)  -The patient is presenting with complaints of cough, shortness of breath, hypotension requiring pressors.  -She was initially admitted at [facility] and now transferred here for further workup.  -Her chest x-ray has been reviewed and shows bilateral basal changes suspicious for pneumonia.  -She will be treated with continuation of her broad-spectrum antibiotic that she was receiving at the [facility].  -I would continue her on IV vancomycin and cefepime.   Acute DVT, involving left upper extremity -Patient presented with left upper extremity swelling, patient has PICC line in the same extremity. -Doppler ultrasound showed acute DVT in the subclavian vein. -She was on heparin drip, switched to Eliquis, likely to switch to either Lovenox or Coumadin.  Left lung non-small cell cancer  -Patient appears to have worsening effusion in the site of her prior lobectomy.  -Cytology report is not available but she did have increased lymphocytes on her pleural effusion analysis, LDH was 111.  -I will provide her with incentive spirometry and flutter valve for pulmonary toilet.   Left sided pleural  effusion -Status post recent thoracentesis and removal of about 1 L of fluids per son. -Increased lymphocytes, LDH was 111 but no cytology. Cytology showed reactive mesothelial cell with acute inflammation. -Per oncology she might need another thoracentesis in the long run or even Pleurx catheter.  Hyponatremia  -Volume overload with third spacing secondary to hypoalbuminemia -The patient appears to have significant volume overload with evidence of bilateral edema and hyponatremia.  -Currently have it all of her labs she may require resumption of her Lasix.  -She recently had received IV fluids resuscitation for her sepsis therefore I would recommend gentle on her diagnosis.  -Monitor ins and outs and daily weight   Abnormal LFT  -Patient does not have any abdominal tenderness  -RUQ ultrasound showed no acute abnormalities apart from cholelithiasis, no Murphy's sign. -Patient probably did have a degree of shocked liver and she was admitted for hypotension which required vasopressors.  Hypothyroidism  -TSH is 12.4, increase Synthroid from 150 to 175, checked in 3-4 weeks.   Code Status: Full code Family Communication: Plan discussed with the patient. Disposition Plan: Remains inpatient   Consultants:  Oncology  Procedures:  None  Antibiotics:  Vancomycin and cefepime   Objective: Filed Vitals:   03/21/14 0448  BP: 129/80  Pulse: 99  Temp: 98.1 F (36.7 C)  Resp: 20    Intake/Output Summary (Last 24 hours) at 03/21/14 1140 Last data filed at 03/20/14 2320  Gross per 24 hour  Intake    440 ml  Output   1000 ml  Net   -560 ml   Filed Weights   03/19/14 0600 03/20/14 0542 03/21/14 0512  Weight: 70.081 kg (154 lb 8 oz) 71.759  kg (158 lb 3.2 oz) 71.215 kg (157 lb)    Exam: General: Alert and awake, oriented x3, not in any acute distress. HEENT: anicteric sclera, pupils reactive to light and accommodation, EOMI CVS: S1-S2 clear, no murmur rubs or  gallops Chest: clear to auscultation bilaterally, no wheezing, rales or rhonchi Abdomen: soft nontender, nondistended, normal bowel sounds, no organomegaly Extremities: no cyanosis, clubbing or edema noted bilaterally Neuro: Cranial nerves II-XII intact, no focal neurological deficits  Data Reviewed: Basic Metabolic Panel:  Recent Labs Lab 03/17/14 0315 03/17/14 0900 03/18/14 0520 03/19/14 0420 03/20/14 0500 03/21/14 0410  NA 126* 129* 127* 126* 125* 126*  K 5.3 5.3 5.0 5.5* 5.7* 5.4*  CL 96 96 95* 95* 94* 93*  CO2 20 23 22 19 19 21   GLUCOSE 113* 115* 127* 160* 117* 117*  BUN 31* 31* 33* 35* 39* 41*  CREATININE 1.32* 1.40* 1.59* 1.62* 1.72* 1.77*  CALCIUM 8.9 9.4 9.0 9.1 9.3 9.3  MG 2.1  --   --   --   --   --   PHOS 2.5  --   --   --   --   --    Liver Function Tests:  Recent Labs Lab 03/17/14 0315 03/18/14 0520 03/19/14 0420 03/20/14 0500 03/21/14 0410  AST 189* 85* 39* 26 80*  ALT 148* 118* 84* 60* 80*  ALKPHOS 213* 205* 206* 192* 204*  BILITOT 0.5 0.4 0.7 0.5 0.4  PROT 5.4* 5.3* 5.5* 5.6* 5.7*  ALBUMIN 2.1* 2.1* 2.1* 2.1* 2.0*   No results found for this basename: LIPASE, AMYLASE,  in the last 168 hours No results found for this basename: AMMONIA,  in the last 168 hours CBC:  Recent Labs Lab 03/17/14 0110  WBC 5.3  NEUTROABS 4.2  HGB 9.3*  HCT 27.6*  MCV 82.6  PLT 230   Cardiac Enzymes: No results found for this basename: CKTOTAL, CKMB, CKMBINDEX, TROPONINI,  in the last 168 hours BNP (last 3 results)  Recent Labs  03/17/14 0105 03/21/14 0410  PROBNP 2029.0* 3211.0*   CBG: No results found for this basename: GLUCAP,  in the last 168 hours  Micro Recent Results (from the past 240 hour(s))  BODY FLUID CULTURE     Status: None   Collection Time    03/18/14 12:01 PM      Result Value Ref Range Status   Specimen Description PLEURAL   Final   Special Requests Normal   Final   Gram Stain     Final   Value: NO WBC SEEN     NO SQUAMOUS  EPITHELIAL CELLS SEEN     NO ORGANISMS SEEN     Performed at [facility]   Culture     Final   Value: NO GROWTH 2 DAYS     Performed at [facility]   Report Status PENDING   Incomplete     Studies: No results found.  Scheduled Meds: . apixaban  10 mg Oral BID  . [START ON 03/26/2014] apixaban  5 mg Oral BID  . aspirin  81 mg Oral Daily  . bisoprolol  2.5 mg Oral Daily  . ceFEPime (MAXIPIME) IV  1 g Intravenous Q24H  . docusate sodium  100 mg Oral Daily  . furosemide  40 mg Intravenous Once  . guaiFENesin  600 mg Oral BID  . ipratropium-albuterol  3 mL Nebulization TID  . [START ON 03/22/2014] levothyroxine  150 mcg Oral QAC breakfast  . polyethylene glycol  17 g Oral Daily   Continuous Infusions:       Time spent: 35 minutes    [facility]  [facility] Pager [phone] If 7PM-7AM, please contact night-coverage at [email or web address], password [facility] 03/21/2014, 11:40 AM  LOS: 5 days

## 2014-03-21 NOTE — Progress Notes (Signed)
CRITICAL VALUE ALERT  Critical value received:  Vancomycin Trough 26.8  Date of notification:  03/21/14  Time of notification:  0272  Critical value read back:Yes.    Nurse who received alert:  Sheffield Slider  MD notified (1st page):  Dr. Hartford Poli  Time of first page:  1331  MD notified (2nd page):  Time of second page:  Responding MD:  Dr. Hartford Poli   Time MD responded:  1332

## 2014-03-22 DIAGNOSIS — R6520 Severe sepsis without septic shock: Secondary | ICD-10-CM

## 2014-03-22 DIAGNOSIS — R6521 Severe sepsis with septic shock: Secondary | ICD-10-CM

## 2014-03-22 DIAGNOSIS — A419 Sepsis, unspecified organism: Secondary | ICD-10-CM

## 2014-03-22 LAB — COMPREHENSIVE METABOLIC PANEL
ALBUMIN: 2 g/dL — AB (ref 3.5–5.2)
ALK PHOS: 204 U/L — AB (ref 39–117)
ALT: 100 U/L — AB (ref 0–35)
AST: 110 U/L — AB (ref 0–37)
Anion gap: 15 (ref 5–15)
BILIRUBIN TOTAL: 0.3 mg/dL (ref 0.3–1.2)
BUN: 45 mg/dL — ABNORMAL HIGH (ref 6–23)
CHLORIDE: 94 meq/L — AB (ref 96–112)
CO2: 21 meq/L (ref 19–32)
Calcium: 9 mg/dL (ref 8.4–10.5)
Creatinine, Ser: 1.89 mg/dL — ABNORMAL HIGH (ref 0.50–1.10)
GFR calc Af Amer: 27 mL/min — ABNORMAL LOW (ref >90)
GFR, EST NON AFRICAN AMERICAN: 23 mL/min — AB (ref >90)
Glucose, Bld: 113 mg/dL — ABNORMAL HIGH (ref 70–99)
POTASSIUM: 5 meq/L (ref 3.7–5.3)
SODIUM: 130 meq/L — AB (ref 137–147)
Total Protein: 5.4 g/dL — ABNORMAL LOW (ref 6.0–8.3)

## 2014-03-22 LAB — VANCOMYCIN, RANDOM: Vancomycin Rm: 25 ug/mL

## 2014-03-22 MED ORDER — OXYCODONE HCL 5 MG PO TABS
2.5000 mg | ORAL_TABLET | Freq: Four times a day (QID) | ORAL | Status: DC | PRN
  Administered 2014-03-22: 2.5 mg via ORAL
  Filled 2014-03-22: qty 1

## 2014-03-22 MED ORDER — BISOPROLOL FUMARATE 5 MG PO TABS: 2.5000 mg | ORAL_TABLET | Freq: Every day | ORAL | Status: AC

## 2014-03-22 MED ORDER — WARFARIN VIDEO
Freq: Once | Status: AC
  Administered 2014-03-22: 14:00:00

## 2014-03-22 MED ORDER — WARFARIN - PHARMACIST DOSING INPATIENT: Freq: Every day | Status: DC

## 2014-03-22 MED ORDER — WARFARIN SODIUM 2 MG PO TABS: 2.0000 mg | ORAL_TABLET | Freq: Once | ORAL | Status: AC

## 2014-03-22 MED ORDER — PATIENT'S GUIDE TO USING COUMADIN BOOK
Freq: Once | Status: AC
  Administered 2014-03-22: 14:00:00
  Filled 2014-03-22: qty 1

## 2014-03-22 MED ORDER — WARFARIN SODIUM 2 MG PO TABS
2.0000 mg | ORAL_TABLET | Freq: Once | ORAL | Status: AC
  Administered 2014-03-23: 2 mg via ORAL
  Filled 2014-03-22: qty 1

## 2014-03-22 MED ORDER — SODIUM CHLORIDE 0.9 % IV SOLN
INTRAVENOUS | Status: DC
  Administered 2014-03-22 – 2014-03-23 (×2): via INTRAVENOUS

## 2014-03-22 MED ORDER — LEVOTHYROXINE SODIUM 137 MCG PO TABS: 137.0000 ug | ORAL_TABLET | Freq: Every day | ORAL | Status: AC

## 2014-03-22 MED ORDER — ENSURE PUDDING PO PUDG
1.0000 | Freq: Three times a day (TID) | ORAL | Status: DC
  Administered 2014-03-22 – 2014-03-23 (×3): 1 via ORAL
  Filled 2014-03-22 (×6): qty 1

## 2014-03-22 MED ORDER — LEVOFLOXACIN 750 MG PO TABS: 750.0000 mg | ORAL_TABLET | Freq: Every day | ORAL | Status: AC

## 2014-03-22 MED ORDER — ENOXAPARIN SODIUM 80 MG/0.8ML ~~LOC~~ SOLN: 70.0000 mg | SUBCUTANEOUS | Status: AC

## 2014-03-22 MED ORDER — ENOXAPARIN SODIUM 80 MG/0.8ML ~~LOC~~ SOLN
70.0000 mg | SUBCUTANEOUS | Status: DC
  Administered 2014-03-23: 70 mg via SUBCUTANEOUS
  Filled 2014-03-22: qty 0.8

## 2014-03-22 NOTE — Discharge Summary (Signed)
Physician Discharge Summary  [name] ASN:[id number] DOB: 31-Jul-1925 DOA: 03/16/2014  PCP: Robyn Haber, MD  Admit date: 03/16/2014 Discharge date: 03/22/2014  Time spent: 40 minutes  Recommendations for Outpatient Follow-up:  1. Followup with the nursing home physician. 2. Started Lovenox 70 mg and Coumadin 2 mg by mouth daily on 03/23/14. 3. Continue Lovenox until Coumadin therapeutic, continue Lovenox for at least 5 days. 4. Patient will be on Levaquin which likely to interact with Coumadin, check INR daily. 5. Follow renal function closely, off of Lasix  Discharge Diagnoses:  Principal Problem:   HCAP (healthcare-associated pneumonia) Active Problems:   Lung cancer, lower lobe   Hypothyroidism   Hypertension   Anemia, unspecified   Septic shock   Hyponatremia   Abnormal LFTs   Fluid overload   Left arm swelling   Pleural effusion, bilateral   Discharge Condition: Stable  Diet recommendation: Heart healthy  Filed Weights   03/20/14 0542 03/21/14 0512 03/22/14 0457  Weight: 71.759 kg (158 lb 3.2 oz) 71.215 kg (157 lb) 70.308 kg (155 lb)    History of present illness:  [name] is a 80 y.o. female with Past medical history of non-small cell lung cancer status post lobectomy in 2005 with adjuvant chemotherapy and radiation on Tarceva at present since 2006, hypothyroidism, hypertension, CHF.  The patient is 80 year old female with past medical history of non-small cell lung cancer. She has chronic mild to moderate size pleural effusion on the left. She was seen here in May 15 at which time she had mild shortness of breath with exertion which was stable. Since last few weeks she has been having progressively worsening shortness of breath along with cough. She also had some sputum production. She was started on antibiotic levofloxacin in the end of July 15. A chest x-ray was performed which showed moderate pleural effusion and she had a thoracentesis with  1.1 L removed. After the procedure when she was at home she had a fall with hypotension per EMS. On further questioning she mentioned that she had similar falls when she stumbled on things twice in that week.  She was initially seen at [place].  Initially she was admitted ICU. She was given multiple IV bolus normal saline. Initially a centerline and later on left-sided PICC line was placed. She was also initially given phenylephrine for her shock.  A chest x-ray was performed which was showing reaccumulation of pleural fluid. Patient was covered with broad-spectrum antibiotics vancomycin and cefepime.  Earlier today she was found to have worsening hyponatremia.  Pulmonary consultation was done today recommended the patient to be transferred to [facility] by patient's primary oncologist is available and the transfer was made and patient physician's request.  At the time of my evaluation patient mentions that she has been feeling significantly better than her initial arrival on 03/12/2014 but she still continues to have cough with yellow-green expectoration, shortness of breath, significant fatigue and generalized weakness. She denies any chest pain fever chills nausea vomiting abdominal pain diarrhea constipation burning urination.  She has noted some swelling of her leg but denies any leg tenderness.  Hospital Course:   HCAP (healthcare-associated pneumonia)  -The patient is presenting with complaints of cough, shortness of breath, hypotension requiring pressors.  -She was initially admitted at [facility] and now transferred here for further workup.  -Her chest x-ray has been reviewed and shows bilateral basal changes suspicious for pneumonia.  -She will be treated with  continuation of her broad-spectrum antibiotic that she was receiving at the [facility].  -She was treated with IV vancomycin and Rocephin while she is in the hospital  for 6 days. -On the day of discharge antibiotics switched to levofloxacin 750 mg for 5 more days.  Acute DVT, involving left upper extremity  -Patient presented with left upper extremity swelling, patient has PICC line in the same extremity.  -Doppler ultrasound showed acute DVT in the subclavian vein.  -She was on heparin drip, switched to Eliquis, now it is contraindicated because of declining renal function.  -Patient switched to Coumadin with Lovenox bridge on discharge.   Acute kidney injury on background of chronic kidney disease  -In May creatinine was 1.0, patient creatinine was 1.32 on presentation on 8/5.  -Creatinine is slowly but consistently rising, today is 1.89.  -Patient has lower extremity edema but likely has intravascular volume depletion because of the hypoalbuminemia.  -I discontinued the Lasix at the time of discharge.  Left lung non-small cell cancer  -Patient appears to have worsening effusion in the site of her prior lobectomy.  -Cytology report is not available but she did have increased lymphocytes on her pleural effusion analysis, LDH was 111.  -I will provide her with incentive spirometry and flutter valve for pulmonary toilet.   Left sided pleural effusion  -Status post recent thoracentesis and removal of about 1 L of fluids per son (in [place]).  This is repeated and was long hospital with removal of 1.2 L of fluids. -Increased lymphocytes, LDH was 111 but no cytology. Cytology showed reactive mesothelial cell with acute inflammation.  -Per oncology she might need another thoracentesis in the long run or even Pleurx catheter.  -Pulmonology recommended to discontinue amlodipine and start beta blockers, hopefully will help to decrease pulmonary artery pressure as well.  Hyponatremia  -Volume overload with third spacing secondary to hypoalbuminemia  -The patient appears to have significant third spacing with evidence of bilateral edema and hyponatremia.    -Monitor ins and outs and daily weight   Abnormal LFT  -Patient does not have any abdominal tenderness  -RUQ ultrasound showed no acute abnormalities apart from cholelithiasis, no Murphy's sign.  -Patient probably did have a degree of shocked liver and she was admitted for hypotension which required vasopressors.   Hypothyroidism  -TSH is 12.4, increase Synthroid from 112 to 137, check in 3-4 weeks.   Procedures:  Left Thoracentesis and removal of 1.2 L of fluids done on 8.6.15  Consultations:  Oncology.  PCCM.  Interventional radio  Discharge Exam: Filed Vitals:   03/22/14 1331  BP: 95/49  Pulse:   Temp: 98 F (36.7 C)  Resp: 16   General: Alert and awake, oriented x3, not in any acute distress. HEENT: anicteric sclera, pupils reactive to light and accommodation, EOMI CVS: S1-S2 clear, no murmur rubs or gallops Chest: clear to auscultation bilaterally, no wheezing, rales or rhonchi Abdomen: soft nontender, nondistended, normal bowel sounds, no organomegaly Extremities: no cyanosis, clubbing or edema noted bilaterally Neuro: Cranial nerves II-XII intact, no focal neurological deficits  Discharge Instructions You were cared for by a hospitalist during your hospital stay. If you have any questions about your discharge medications or the care you received while you were in the hospital after you are discharged, you can call the unit and asked to speak with the hospitalist on call if the hospitalist that took care of you is not available. Once you are discharged, your primary care physician will  handle any further medical issues. Please note that NO REFILLS for any discharge medications will be authorized once you are discharged, as it is imperative that you return to your primary care physician (or establish a relationship with a primary care physician if you do not have one) for your aftercare needs so that they can reassess your need for medications and monitor your lab  values.      Discharge Instructions   Diet - low sodium heart healthy    Complete by:  As directed      Increase activity slowly    Complete by:  As directed             Medication List    STOP taking these medications       amLODipine 5 MG tablet  Commonly known as:  NORVASC     erlotinib 100 MG tablet  Commonly known as:  TARCEVA     furosemide 20 MG tablet  Commonly known as:  LASIX     ibuprofen 200 MG tablet  Commonly known as:  ADVIL,MOTRIN      TAKE these medications       aspirin 81 MG tablet  Take 81 mg by mouth daily.     bisoprolol 5 MG tablet  Commonly known as:  ZEBETA  Take 0.5 tablets (2.5 mg total) by mouth daily.     CALCIUM + D PO  Take 1 tablet by mouth daily.     enoxaparin 80 MG/0.8ML injection  Commonly known as:  LOVENOX  Inject 0.7 mLs (70 mg total) into the skin daily.  Start taking on:  03/23/2014     ferrous gluconate 324 MG tablet  Commonly known as:  FERGON  Take 324 mg by mouth daily with breakfast.     levofloxacin 750 MG tablet  Commonly known as:  LEVAQUIN  Take 1 tablet (750 mg total) by mouth daily.     levothyroxine 137 MCG tablet  Commonly known as:  SYNTHROID, LEVOTHROID  Take 1 tablet (137 mcg total) by mouth daily before breakfast.     loratadine 10 MG tablet  Commonly known as:  CLARITIN  Take 10 mg by mouth daily.     Magnesium 100 MG Caps  Take 1 capsule by mouth daily.     meclizine 12.5 MG tablet  Commonly known as:  ANTIVERT  Take 1 tablet (12.5 mg total) by mouth 3 (three) times daily as needed for dizziness.     metroNIDAZOLE 0.75 % cream  Commonly known as:  METROCREAM  Apply 1 application topically 2 (two) times daily as needed (rosacea).     polyethylene glycol packet  Commonly known as:  MIRALAX / GLYCOLAX  Take 17 g by mouth daily as needed for mild constipation.     vitamin B-12 1000 MCG tablet  Commonly known as:  CYANOCOBALAMIN  Take 1,000 mcg by mouth daily.     vitamin C 500 MG  tablet  Commonly known as:  ASCORBIC ACID  Take 500 mg by mouth daily.     warfarin 2 MG tablet  Commonly known as:  COUMADIN  Take 1 tablet (2 mg total) by mouth once.  Start taking on:  03/23/2014     zolpidem 10 MG tablet  Commonly known as:  AMBIEN  Take 2.5-5 mg by mouth at bedtime as needed for sleep.       Allergies  Allergen Reactions  . Keflex [Cephalexin] Rash  . Sulfa Antibiotics Nausea And Vomiting  .  Other     Pt states antibiotics--unable to remember names of meds  . Paroxetine Hcl Anxiety  . Ramipril [Ramipril] Rash and Cough  . Sertraline Anxiety      The results of significant diagnostics from this hospitalization (including imaging, microbiology, ancillary and laboratory) are listed below for reference.    Significant Diagnostic Studies: Dg Chest 1 View  03/18/2014   CLINICAL DATA:  Lung cancer. Left pleural effusion. Status post left thoracentesis.  EXAM: CHEST - 1 VIEW  COMPARISON:  CT scan dated 03/17/2014  FINDINGS: There has been marked reduction in the large left pleural effusion with improved aeration in the left lung. There is a moderate right effusion with secondary compressive atelectasis. Pulmonary vascularity is normal. Scarring in the left hilum.  IMPRESSION: No pneumothorax after left thoracentesis. Marked reduction in left effusion. Persistent moderate right effusion.   Electronically Signed   By: Rozetta Nunnery M.D.   On: 03/18/2014 12:32   Ct Angio Chest Pe W/cm &/or Wo Cm  03/17/2014   CLINICAL DATA:  Shortness of breath and weakness.  EXAM: CT ANGIOGRAPHY CHEST WITH CONTRAST  TECHNIQUE: Multidetector CT imaging of the chest was performed using the standard protocol during bolus administration of intravenous contrast. Multiplanar CT image reconstructions and MIPs were obtained to evaluate the vascular anatomy.  CONTRAST:  113mL OMNIPAQUE IOHEXOL 350 MG/ML SOLN  COMPARISON:  Chest x-ray 01/06/2014.  CT 03/07/2011.  FINDINGS: Thoracic aorta is  atherosclerotic. No aneurysm. No dissection. Great vessels are patent. Pulmonary arteries are normal. Coronary artery disease. Moderate pericardial effusion.  Mediastinum difficult to evaluate due to the pericardial effusion and large pleural effusions. The thoracic esophagus is slightly dilated.  Narrowing of both mainstem bronchi noted secondary to the large pleural effusions. Basilar atelectasis. Mild diffuse interstitial prominence. A component of interstitial edema cannot be excluded. Very large bilateral pleural effusions. No pneumothorax.  Thyroid region unremarkable. Shotty axillary lymph nodes. Diffuse anasarca. Prominent degenerative changes thoracic spine.  Review of the MIP images confirms the above findings.  IMPRESSION: 1. No evidence pulmonary embolus. 2. Moderate pericardial effusion. 3. Coronary artery disease. 4. Severe bilateral pleural effusions. Basilar atelectasis. Component of interstitial edema cannot be excluded. Diffuse anasarca. 5. Thoracic esophagus slightly dilated.   Electronically Signed   By: Marcello Moores  Register   On: 03/17/2014 17:27   US Abdomen Limited Ruq  03/17/2014   CLINICAL DATA:  Elevated liver function tests  EXAM: US ABDOMEN LIMITED - RIGHT UPPER QUADRANT  COMPARISON:  None.  FINDINGS: Gallbladder:  Multiple gallstones. The largest is 7 mm. No wall thickening. Negative Murphy sign.  Common bile duct:  Diameter: 4.2 mm in caliber.  Liver:  1.1 x 1.1 x 1.1 cm homogeneous hyperechoic lesion in the lateral segment of the left lobe of the liver. Otherwise, there is diffuse homogeneous echogenicity within the liver.  Additional findings:  Right pleural effusion.  IMPRESSION: Cholelithiasis.  1.1 cm hyperechoic lesion in the lateral segment of the left lobe of the liver. This is most likely benign. Followup ultrasound in 6 months is recommended to ensure stability. If the patient has high risk of malignancy or has a history of malignancy, MRI is recommended.  Right pleural  effusion.   Electronically Signed   By: Maryclare Bean M.D.   On: 03/17/2014 08:41   US Thoracentesis Asp Pleural Space W/img Guide  03/18/2014   CLINICAL DATA:  Patient with remote history of lung carcinoma, dyspnea, bilateral pleural effusions. Request is made for diagnostic and therapeutic  left thoracentesis.  EXAM: ULTRASOUND GUIDED DIAGNOSTIC AND THERAPEUTIC LEFT THORACENTESIS  COMPARISON:  None.  PROCEDURE: An ultrasound guided thoracentesis was thoroughly discussed with the patient and questions answered. The benefits, risks, alternatives and complications were also discussed. The patient understands and wishes to proceed with the procedure. Written consent was obtained.  Ultrasound was performed to localize and mark an adequate pocket of fluid in the left chest. The area was then prepped and draped in the normal sterile fashion. 1% Lidocaine was used for local anesthesia. Under ultrasound guidance a 19 gauge Yueh catheter was introduced. Thoracentesis was performed. The catheter was removed and a dressing applied.  Complications:  None.  FINDINGS: A total of approximately 1.2 liters of slightly turbid, amber fluid was removed. The fluid sample wassent for laboratory analysis.  IMPRESSION: Successful ultrasound guided diagnostic and therapeutic left thoracentesis yielding 1.2 liters of pleural fluid.  Read by: Rowe Robert, PA-C   Electronically Signed   By: Maryclare Bean M.D.   On: 03/18/2014 13:54    Microbiology: Recent Results (from the past 240 hour(s))  BODY FLUID CULTURE     Status: None   Collection Time    03/18/14 12:01 PM      Result Value Ref Range Status   Specimen Description PLEURAL   Final   Special Requests Normal   Final   Gram Stain     Final   Value: NO WBC SEEN     NO SQUAMOUS EPITHELIAL CELLS SEEN     NO ORGANISMS SEEN     Performed at [facility]   Culture     Final   Value: NO GROWTH 3 DAYS     Performed at [facility]   Report Status 03/21/2014 FINAL    Final     Labs: Basic Metabolic Panel:  Recent Labs Lab 03/17/14 0315  03/18/14 0520 03/19/14 0420 03/20/14 0500 03/21/14 0410 03/22/14 0445  NA 126*  < > 127* 126* 125* 126* 130*  K 5.3  < > 5.0 5.5* 5.7* 5.4* 5.0  CL 96  < > 95* 95* 94* 93* 94*  CO2 20  < > 22 19 19 21 21   GLUCOSE 113*  < > 127* 160* 117* 117* 113*  BUN 31*  < > 33* 35* 39* 41* 45*  CREATININE 1.32*  < > 1.59* 1.62* 1.72* 1.77* 1.89*  CALCIUM 8.9  < > 9.0 9.1 9.3 9.3 9.0  MG 2.1  --   --   --   --   --   --   PHOS 2.5  --   --   --   --   --   --   < > = values in this interval not displayed. Liver Function Tests:  Recent Labs Lab 03/18/14 0520 03/19/14 0420 03/20/14 0500 03/21/14 0410 03/22/14 0445  AST 85* 39* 26 80* 110*  ALT 118* 84* 60* 80* 100*  ALKPHOS 205* 206* 192* 204* 204*  BILITOT 0.4 0.7 0.5 0.4 0.3  PROT 5.3* 5.5* 5.6* 5.7* 5.4*  ALBUMIN 2.1* 2.1* 2.1* 2.0* 2.0*   No results found for this basename: LIPASE, AMYLASE,  in the last 168 hours No results found for this basename: AMMONIA,  in the last 168 hours CBC:  Recent Labs Lab 03/17/14 0110  WBC 5.3  NEUTROABS 4.2  HGB 9.3*  HCT 27.6*  MCV 82.6  PLT 230   Cardiac Enzymes: No results found for this basename: CKTOTAL, CKMB, CKMBINDEX, TROPONINI,  in the  last 168 hours BNP: BNP (last 3 results)  Recent Labs  03/17/14 0105 03/21/14 0410  PROBNP 2029.0* 3211.0*   CBG: No results found for this basename: GLUCAP,  in the last 168 hours     Signed:  Taiya Nutting A  [facility] 03/22/2014, 2:10 PM

## 2014-03-22 NOTE — Progress Notes (Signed)
ANTICOAGULATION CONSULT NOTE - Follow Up Consult  Pharmacy Consult for Lovenox / Warfarin Indication: Acute DVT  Allergies  Allergen Reactions  . Keflex [Cephalexin] Rash  . Sulfa Antibiotics Nausea And Vomiting  . Other     Pt states antibiotics--unable to remember names of meds  . Paroxetine Hcl Anxiety  . Ramipril [Ramipril] Rash and Cough  . Sertraline Anxiety    Patient Measurements: Height: 5\' 5"  (165.1 cm) Weight: 155 lb (70.308 kg) IBW/kg (Calculated) : 57  Vital Signs: Temp: 98.3 F (36.8 C) (08/10 0457) Temp src: Oral (08/10 0457) BP: 125/73 mmHg (08/10 0457) Pulse Rate: 89 (08/10 0457)  Labs:  Recent Labs  03/20/14 0500 03/21/14 0410 03/22/14 0445  CREATININE 1.72* 1.77* 1.89*    Estimated Creatinine Clearance: 21 ml/min (by C-G formula based on Cr of 1.89).   Medications:  Scheduled:  . aspirin  81 mg Oral Daily  . bisoprolol  2.5 mg Oral Daily  . ceFEPime (MAXIPIME) IV  1 g Intravenous Q24H  . docusate sodium  100 mg Oral Daily  . feeding supplement (ENSURE)  1 Container Oral TID WC  . guaiFENesin  600 mg Oral BID  . ipratropium-albuterol  3 mL Nebulization TID  . levothyroxine  175 mcg Oral QAC breakfast  . polyethylene glycol  17 g Oral Daily   Infusions:  . sodium chloride     PRN: meclizine, oxyCODONE, zolpidem  Assessment: 72 yof with PMH of NSCLC s/p lobectomy in 2005 with adjuvent chemo and radiation on Tarceva since 2006, hypothyroidism, HTN, CHF. Venus duplex revealed left upper extremity DVT. Pharmacy consulted to dose IV heparin on 8/5, then transitioned to apixaban on 8/7.  SCr has continued to rise during this admission, up to 1.89 today with CrCl ~ 20 ml/min. Clinical trials evaluating apixaban for acute treatment of VTE did NOT include patients with CrCl < 25 ml/min. Therefore, pharmacy is now consulted to change from apixaban to warfarin with lovenox bridge.  Note rising LFTs: AST = 110, ALT = 100, AlkPhos = 204, albumin =  2.0 Most recent INR = 1.15 on 8/5 Most recent CBC on 8/5: Hgb = 9.3, Hct = 27.6, Plts = 230  No drug interactions currently, other than concurrent aspirin 81mg  daily.  Goal of Therapy:  INR 2-3 Monitor platelets by anticoagulation protocol: Yes   Plan:   Patient has already received a dose of apixaban 10mg  at 10AM today, therefore, will delay start of lovenox and warfarin until tomorrow 8/11  On 8/11 at 10AM, begin Lovenox 70mg  (1mg /kg) sq q24h (for CrCl < 30 ml/min)  On 8/11, begin Warfarin 2 mg and check INR daily until stable. Will most likely require < 2.5mg  daily given age, LFTs and declining renal function.  DC Lovenox once INR therapeutic. Do not anticipate that she will need a 5 day overlap since she has already been on full-dose anticoagulation since 8/5.   Peggyann Juba, PharmD, BCPS Pager: [phone] 03/22/2014,1:18 PM

## 2014-03-22 NOTE — Progress Notes (Signed)
ANTIBIOTIC CONSULT NOTE - Follow-up  Pharmacy Consult for Cefepime and Vancomycin  Indication: pneumonia  Allergies  Allergen Reactions  . Keflex [Cephalexin] Rash  . Sulfa Antibiotics Nausea And Vomiting  . Other     Pt states antibiotics--unable to remember names of meds  . Paroxetine Hcl Anxiety  . Ramipril [Ramipril] Rash and Cough  . Sertraline Anxiety    Patient Measurements: Height: 5\' 5"  (165.1 cm) Weight: 155 lb (70.308 kg) IBW/kg (Calculated) : 57   Vital Signs: Temp: 98.3 F (36.8 C) (08/10 0457) Temp src: Oral (08/10 0457) BP: 125/73 mmHg (08/10 0457) Pulse Rate: 89 (08/10 0457) Intake/Output from previous day: 08/09 0701 - 08/10 0700 In: 50 [IV Piggyback:50] Out: 700 [Urine:700] Intake/Output from this shift:    Labs:  Recent Labs  03/20/14 0500 03/21/14 0410 03/22/14 0445  CREATININE 1.72* 1.77* 1.89*   Estimated Creatinine Clearance: 21 ml/min (by C-G formula based on Cr of 1.89).  Recent Labs  03/21/14 1146 03/22/14 0445  VANCOTROUGH 26.8*  --   VANCORANDOM  --  25.0     Microbiology: Recent Results (from the past 720 hour(s))  BODY FLUID CULTURE     Status: None   Collection Time    03/18/14 12:01 PM      Result Value Ref Range Status   Specimen Description PLEURAL   Final   Special Requests Normal   Final   Gram Stain     Final   Value: NO WBC SEEN     NO SQUAMOUS EPITHELIAL CELLS SEEN     NO ORGANISMS SEEN     Performed at [facility]   Culture     Final   Value: NO GROWTH 3 DAYS     Performed at [facility]   Report Status 03/21/2014 FINAL   Final    Medications:  Scheduled:  . apixaban  10 mg Oral BID  . [START ON 03/26/2014] apixaban  5 mg Oral BID  . aspirin  81 mg Oral Daily  . bisoprolol  2.5 mg Oral Daily  . ceFEPime (MAXIPIME) IV  1 g Intravenous Q24H  . docusate sodium  100 mg Oral Daily  . guaiFENesin  600 mg Oral BID  . ipratropium-albuterol  3 mL Nebulization TID  . levothyroxine  175  mcg Oral QAC breakfast  . polyethylene glycol  17 g Oral Daily   Infusions:    Assessment: 89 yoF with hx non-small cell Lung Ca on Tarceva, hypothyroidism, HTN and CHF. Pt was initially at [facility] and transferred to [facility] for further workup. Chest x-ray shows bilateral basal changes suspicious for pneumonia. Pharmacy consulted to continue cefepime and vanco for HCAP.   Day #7 Abx 8/4 >>cefepime >> 8/4 >>vancomycin >>   Tmax: afebrile WBCs: 5.3 (8/5) Renal: SCr continues to rise with est CrCl 21  8/6 pleural fluid: NGF  Drug level / dose changes info: 8/5 1130 VT: 21.1 on Vanc 1g Q24H. Decrease to 750mg  Q24H 8/9 VT at 1130 = 26.8 - d/c'd vanc prior to getting trough, will not restart vanc at this time but will recheck a vanc level tomorrow AM with SCr to further evaluate how pt is clearing  8/10 random level = 25, SCr up 1.89 - cont to hold.  Goal of Therapy:  Vancomycin trough level 15-20 mcg/ml Cefepime dose per renal function  Plan:   Continue to hold vancomycin. Pt is clearing drug very slowly so do not expect level will be < 15  mcg/m in the next 24 hours.  If therapy continues, will check random level 48 hours from now (8/12 AM)  Continue cefepime 1g q24h  Follow up plans for de-escalation and duration of antibiotic therapy  Peggyann Juba, PharmD, BCPS Pager: [phone]  03/22/2014 7:52 AM

## 2014-03-22 NOTE — Progress Notes (Signed)
Physical Therapy Treatment Patient Details Name: [name] MRN: [id number] DOB: 11/07/1924 Today's Date: Apr 15, 2014    History of Present Illness 80 yo female adm  03/16/14 with cough, SOB and LUE swelling; Pt was found to have HCAP and LUE DVT; she had thoracentesis 03/18/14 removing 1.2L of fluid; PMHx: CHF, HTN, NSCLC-s/p lobectomy in 2005    PT Comments    Progressing, possible D/C tomorrow  Follow Up Recommendations  SNF     Equipment Recommendations  None recommended by PT    Recommendations for Other Services       Precautions / Restrictions Precautions Precautions: Fall Precaution Comments: L UE DVT    Mobility  Bed Mobility               General bed mobility comments: pt declined OOB stating she was up earlier and is sleepy from meds  Transfers                    Ambulation/Gait                 Stairs            Wheelchair Mobility    Modified Rankin (Stroke Patients Only)       Balance                                    Cognition Arousal/Alertness: Awake/alert Behavior During Therapy: WFL for tasks assessed/performed Overall Cognitive Status: Within Functional Limits for tasks assessed                      Exercises General Exercises - Lower Extremity Ankle Circles/Pumps: AROM;Both;10 reps;Supine Quad Sets: AROM;Both;10 reps Short Arc Quad: AROM;Strengthening;Both;10 reps Heel Slides: AROM;Strengthening;Both;10 reps Hip ABduction/ADduction: AROM;AAROM;Both;10 reps;Seated    General Comments        Pertinent Vitals/Pain Pain Assessment: No/denies pain    Home Living                      Prior Function            PT Goals (current goals can now be found in the care plan section) Acute Rehab PT Goals Patient Stated Goal: be independent again PT Goal Formulation: With patient/family Time For Goal Achievement: 04/01/14 Potential to Achieve Goals: Good Progress  towards PT goals: Progressing toward goals    Frequency  Min 3X/week    PT Plan Current plan remains appropriate    Co-evaluation             End of Session   Activity Tolerance: Patient tolerated treatment well Patient left: in bed;with call bell/phone within reach;with family/visitor present     Time: 5379-4327 PT Time Calculation (min): 15 min  Charges:  $Therapeutic Exercise: 8-22 mins                    G Codes:      Dalya Maselli 2014-04-15, 5:07 PM

## 2014-03-22 NOTE — Progress Notes (Signed)
[facility] PROGRESS NOTE   KINZEE HAPPEL ONG:[id number] DOB: 13-Jul-1925 DOA: 03/16/2014 PCP: Robyn Haber, MD  HPI/Subjective: Seen with son at bedside, denies any significant complaints. Creatinine is consistently increasing since admission to the hospital. Currently Eliquis is contraindicated because of the declining renal function, I will switch to Coumadin with Lovenox bridge.  Assessment/Plan: Principal Problem:   HCAP (healthcare-associated pneumonia) Active Problems:   Lung cancer, lower lobe   Hypothyroidism   Hypertension   Anemia, unspecified   Septic shock   Hyponatremia   Abnormal LFTs   Fluid overload   Left arm swelling   Pleural effusion, bilateral    HCAP (healthcare-associated pneumonia)  -The patient is presenting with complaints of cough, shortness of breath, hypotension requiring pressors.  -She was initially admitted at [facility] and now transferred here for further workup.  -Her chest x-ray has been reviewed and shows bilateral basal changes suspicious for pneumonia.  -She will be treated with continuation of her broad-spectrum antibiotic that she was receiving at the [facility].  -I would continue her on IV vancomycin and cefepime.   Acute DVT, involving left upper extremity -Patient presented with left upper extremity swelling, patient has PICC line in the same extremity. -Doppler ultrasound showed acute DVT in the subclavian vein. -She was on heparin drip, switched to Eliquis, now it is contraindicated because of declining renal function. -I will switch to Coumadin with Lovenox bridge.  Acute kidney injury on background of chronic kidney disease -In May creatinine was 1.0, patient creatinine was 1.32 on presentation on 8/5. -Creatinine is slowly but consistently rising, today is 1.89. -Patient has lower extremity edema but likely has intravascular volume depletion because of the hypoalbuminemia. -I will  give IV fluids at a slow rate.  Left lung non-small cell cancer  -Patient appears to have worsening effusion in the site of her prior lobectomy.  -Cytology report is not available but she did have increased lymphocytes on her pleural effusion analysis, LDH was 111.  -I will provide her with incentive spirometry and flutter valve for pulmonary toilet.   Left sided pleural effusion -Status post recent thoracentesis and removal of about 1 L of fluids per son. -Increased lymphocytes, LDH was 111 but no cytology. Cytology showed reactive mesothelial cell with acute inflammation. -Per oncology she might need another thoracentesis in the long run or even Pleurx catheter.  Hyponatremia  -Volume overload with third spacing secondary to hypoalbuminemia -The patient appears to have significant volume overload with evidence of bilateral edema and hyponatremia.  -Currently have it all of her labs she may require resumption of her Lasix.  -She recently had received IV fluids resuscitation for her sepsis therefore I would recommend gentle on her diagnosis.  -Monitor ins and outs and daily weight   Abnormal LFT  -Patient does not have any abdominal tenderness  -RUQ ultrasound showed no acute abnormalities apart from cholelithiasis, no Murphy's sign. -Patient probably did have a degree of shocked liver and she was admitted for hypotension which required vasopressors.  Hypothyroidism  -TSH is 12.4, increase Synthroid from 150 to 175, checked in 3-4 weeks.   Code Status: Full code Family Communication: Plan discussed with the patient. Disposition Plan: Remains inpatient   Consultants:  Oncology  Procedures:  None  Antibiotics:  Vancomycin and cefepime   Objective: Filed Vitals:   03/22/14 0457  BP: 125/73  Pulse: 89  Temp: 98.3 F (36.8 C)  Resp: 18    Intake/Output Summary (Last 24 hours) at  03/22/14 1259 Last data filed at 03/22/14 1030  Gross per 24 hour  Intake    260 ml    Output    450 ml  Net   -190 ml   Filed Weights   03/20/14 0542 03/21/14 0512 03/22/14 0457  Weight: 71.759 kg (158 lb 3.2 oz) 71.215 kg (157 lb) 70.308 kg (155 lb)    Exam: General: Alert and awake, oriented x3, not in any acute distress. HEENT: anicteric sclera, pupils reactive to light and accommodation, EOMI CVS: S1-S2 clear, no murmur rubs or gallops Chest: clear to auscultation bilaterally, no wheezing, rales or rhonchi Abdomen: soft nontender, nondistended, normal bowel sounds, no organomegaly Extremities: no cyanosis, clubbing or edema noted bilaterally Neuro: Cranial nerves II-XII intact, no focal neurological deficits  Data Reviewed: Basic Metabolic Panel:  Recent Labs Lab 03/17/14 0315  03/18/14 0520 03/19/14 0420 03/20/14 0500 03/21/14 0410 03/22/14 0445  NA 126*  < > 127* 126* 125* 126* 130*  K 5.3  < > 5.0 5.5* 5.7* 5.4* 5.0  CL 96  < > 95* 95* 94* 93* 94*  CO2 20  < > 22 19 19 21 21   GLUCOSE 113*  < > 127* 160* 117* 117* 113*  BUN 31*  < > 33* 35* 39* 41* 45*  CREATININE 1.32*  < > 1.59* 1.62* 1.72* 1.77* 1.89*  CALCIUM 8.9  < > 9.0 9.1 9.3 9.3 9.0  MG 2.1  --   --   --   --   --   --   PHOS 2.5  --   --   --   --   --   --   < > = values in this interval not displayed. Liver Function Tests:  Recent Labs Lab 03/18/14 0520 03/19/14 0420 03/20/14 0500 03/21/14 0410 03/22/14 0445  AST 85* 39* 26 80* 110*  ALT 118* 84* 60* 80* 100*  ALKPHOS 205* 206* 192* 204* 204*  BILITOT 0.4 0.7 0.5 0.4 0.3  PROT 5.3* 5.5* 5.6* 5.7* 5.4*  ALBUMIN 2.1* 2.1* 2.1* 2.0* 2.0*   No results found for this basename: LIPASE, AMYLASE,  in the last 168 hours No results found for this basename: AMMONIA,  in the last 168 hours CBC:  Recent Labs Lab 03/17/14 0110  WBC 5.3  NEUTROABS 4.2  HGB 9.3*  HCT 27.6*  MCV 82.6  PLT 230   Cardiac Enzymes: No results found for this basename: CKTOTAL, CKMB, CKMBINDEX, TROPONINI,  in the last 168 hours BNP (last 3  results)  Recent Labs  03/17/14 0105 03/21/14 0410  PROBNP 2029.0* 3211.0*   CBG: No results found for this basename: GLUCAP,  in the last 168 hours  Micro Recent Results (from the past 240 hour(s))  BODY FLUID CULTURE     Status: None   Collection Time    03/18/14 12:01 PM      Result Value Ref Range Status   Specimen Description PLEURAL   Final   Special Requests Normal   Final   Gram Stain     Final   Value: NO WBC SEEN     NO SQUAMOUS EPITHELIAL CELLS SEEN     NO ORGANISMS SEEN     Performed at [facility]   Culture     Final   Value: NO GROWTH 3 DAYS     Performed at [facility]   Report Status 03/21/2014 FINAL   Final     Studies: No results found.  Scheduled Meds: .  apixaban  10 mg Oral BID  . [START ON 03/26/2014] apixaban  5 mg Oral BID  . aspirin  81 mg Oral Daily  . bisoprolol  2.5 mg Oral Daily  . ceFEPime (MAXIPIME) IV  1 g Intravenous Q24H  . docusate sodium  100 mg Oral Daily  . feeding supplement (ENSURE)  1 Container Oral TID WC  . guaiFENesin  600 mg Oral BID  . ipratropium-albuterol  3 mL Nebulization TID  . levothyroxine  175 mcg Oral QAC breakfast  . polyethylene glycol  17 g Oral Daily   Continuous Infusions:       Time spent: 35 minutes    [facility]  [facility] Pager [phone] If 7PM-7AM, please contact night-coverage at [email or web address], password [facility] 03/22/2014, 12:59 PM  LOS: 6 days

## 2014-03-22 NOTE — Progress Notes (Signed)
PULMONARY / CRITICAL CARE MEDICINE   Name: [name] MRN: [id number] DOB: 1924-11-12    ADMISSION DATE:  03/16/2014 CONSULTATION DATE:  03/20/14   REFERRING MD :  Kizzie Furnish  CHIEF COMPLAINT:  sob  INITIAL PRESENTATION:  88 yowf s/p status post left lower lobectomy in March 2005 for a 3.2 cm, grade 2 lung adenocarcinoma with multiple positive lymph nodes followed by Dr Jana Hakim with bilateral pleural effusions alerady tapped on L in late July at outside [place] and then again at [facility] 8/6 with some improvement in SOB but BP low and PCCM service asked to evaluate am 8/8   STUDIES:  8/06 Echo >>Hyperdynamic LV function, LVEF 02%, diastolic dysfunction, moderate TR with mild to moderate pulmonary hypertension, small cirumferential pericardial effusion with left pleural effusion.   SIGNIFICANT EVENTS: 8/6  L thoracentesis >>  x 1.1 liters features borderline exudative/ inflammatory on cytology       SUBJECTIVE: pt denies acute complaints.  Reports poor appetite.    VITAL SIGNS: Temp:  [97.3 F (36.3 C)-98.3 F (36.8 C)] 98.3 F (36.8 C) (08/10 0457) Pulse Rate:  [83-94] 89 (08/10 0457) Resp:  [16-20] 18 (08/10 0457) BP: (114-127)/(60-73) 125/73 mmHg (08/10 0457) SpO2:  [85 %-100 %] 94 % (08/10 0931) Weight:  [155 lb (70.308 kg)] 155 lb (70.308 kg) (08/10 0457) FIO2  RA  INTAKE / OUTPUT:  Intake/Output Summary (Last 24 hours) at 03/22/14 1013 Last data filed at 03/22/14 0911  Gross per 24 hour  Intake    210 ml  Output    450 ml  Net   -240 ml    PHYSICAL EXAMINATION: Elderly chronically ill wf nad Pt alert, approp nad  Oropharanx clear Neck supple, no jvd Resp's even/non-labored, lungs bilaterally with few scattered rhonchi RRR no m/r/g Abd obese, soft Extr wam with no clubbing noted - 2 plus pitting edema bilaterally Neuro  No motor deficits  LABS:  Lab Results  Component Value Date   TSH 12.400* 03/21/2014    Lab Results  Component Value Date   PROBNP  3211.0* 03/21/2014    CBC  Recent Labs Lab 03/17/14 0110  WBC 5.3  HGB 9.3*  HCT 27.6*  PLT 230   Coag's  Recent Labs Lab 03/17/14 0110  APTT 36  INR 1.15   BMET  Recent Labs Lab 03/20/14 0500 03/21/14 0410 03/22/14 0445  NA 125* 126* 130*  K 5.7* 5.4* 5.0  CL 94* 93* 94*  CO2 19 21 21   BUN 39* 41* 45*  CREATININE 1.72* 1.77* 1.89*  GLUCOSE 117* 117* 113*   Electrolytes  Recent Labs Lab 03/17/14 0315  03/20/14 0500 03/21/14 0410 03/22/14 0445  CALCIUM 8.9  < > 9.3 9.3 9.0  MG 2.1  --   --   --   --   PHOS 2.5  --   --   --   --   < > = values in this interval not displayed.  Liver Enzymes  Recent Labs Lab 03/20/14 0500 03/21/14 0410 03/22/14 0445  AST 26 80* 110*  ALT 60* 80* 100*  ALKPHOS 192* 204* 204*  BILITOT 0.5 0.4 0.3  ALBUMIN 2.1* 2.0* 2.0*   Cardiac Enzymes  Recent Labs Lab 03/17/14 0105 03/21/14 0410  PROBNP 2029.0* 3211.0*   Glucose No results found for this basename: GLUCAP,  in the last 168 hours  Imaging No results found.   ASSESSMENT / PLAN:  Acute on chronic bilateral effusions - borderline exudative on L with  low serum albumin and diastolic CHF / CRI  Plan: Avoid nsaids/ norvarsc   Rx With BB / diuretics to lower systemic and pulmonary venous pressures  Aggressive nutrition if able, try to build nutritional stores (no indication for IV alb here).   If effusions become symptomatic again, consider shift completely to comfort care noting she is DNR status -doubt pleurX effective here Pulmonary hygiene  CRI Hyponatremia with Hyperkalemia - Very unlikely addisonian in setting of pitting edema   Plan: lasix and water restriction  Cortisol within normal limits  Hypothyroidism - not on adequate rx  Plan: Increase synthroid 8/9 am and recheck in 4 weeks   PCCM will sign off.  Please call back if we can be of further assistance.    Noe Gens, NP-C [facility] Pgr: [phone] or  [phone]  Rigoberto Noel MD  03/22/2014, 10:13 AM

## 2014-03-22 NOTE — Progress Notes (Signed)
INITIAL NUTRITION ASSESSMENT  DOCUMENTATION CODES Per approved criteria  -Not Applicable   INTERVENTION: Ensure Pudding po TID, each supplement provides 170 kcal and 4 grams of protein  NUTRITION DIAGNOSIS: Inadequate oral intake related to chronic illness as evidenced by reported intake less than estimated needs.   Goal: Pt to meet >/= 90% of their estimated nutrition needs   Monitor:  Weight trends, po intake, acceptance of supplements, labs  Reason for Assessment: c/s for poor po  80 y.o. female  Admitting Dx: HCAP (healthcare-associated pneumonia)  ASSESSMENT: 80 year old female with past medical history of non-small cell lung cancer. She has chronic mild to moderate size pleural effusion on the left. She was seen here in May 15 at which time she had mild shortness of breath with exertion which was stable. Since last few weeks she has been having progressively worsening shortness of breath along with cough.   - Pt reports that she is unsure if she has lost any weight recently.  She said that she believes her UBW is around 125 lbs. Pt had eaten ~50 of her lunch, and said she ate so little because she had an ensure pudding cup right before her tray arrived. Meal completion is 50-100%. Pt with no significant fat or muscle wasting at this time.  Na 130 K WNL  Height: Ht Readings from Last 1 Encounters:  03/17/14 5\' 5"  (1.651 m)    Weight: Wt Readings from Last 1 Encounters:  03/22/14 155 lb (70.308 kg)    Ideal Body Weight: 57 kg  % Ideal Body Weight: unknown, wt in chart obtained when pt was fluid overloaded.  Wt Readings from Last 10 Encounters:  03/22/14 155 lb (70.308 kg)  01/06/14 136 lb 9.6 oz (61.961 kg)  09/08/13 130 lb 3.2 oz (59.058 kg)  05/19/13 127 lb 4.8 oz (57.743 kg)  02/10/13 130 lb (58.968 kg)  11/10/12 130 lb 14.4 oz (59.376 kg)  05/08/12 134 lb 12.8 oz (61.145 kg)  11/08/11 139 lb 4.8 oz (63.186 kg)  07/11/11 139 lb 8 oz (63.277 kg)     Usual Body Weight: 125 lbs  % Usual Body Weight: 109%  BMI:  Body mass index is 25.79 kg/(m^2).  Estimated Nutritional Needs: Kcal: 1500-1700 Protein: 80-90 g Fluid: ~1.7 L/day  Skin: Intact  Diet Order: Cardiac  EDUCATION NEEDS: -Education needs addressed   Intake/Output Summary (Last 24 hours) at 03/22/14 1446 Last data filed at 03/22/14 1030  Gross per 24 hour  Intake    260 ml  Output    200 ml  Net     60 ml    Last BM: 8/9   Labs:   Recent Labs Lab 03/17/14 0315  03/20/14 0500 03/21/14 0410 03/22/14 0445  NA 126*  < > 125* 126* 130*  K 5.3  < > 5.7* 5.4* 5.0  CL 96  < > 94* 93* 94*  CO2 20  < > 19 21 21   BUN 31*  < > 39* 41* 45*  CREATININE 1.32*  < > 1.72* 1.77* 1.89*  CALCIUM 8.9  < > 9.3 9.3 9.0  MG 2.1  --   --   --   --   PHOS 2.5  --   --   --   --   GLUCOSE 113*  < > 117* 117* 113*  < > = values in this interval not displayed.  CBG (last 3)  No results found for this basename: GLUCAP,  in the last 72 hours  Scheduled Meds: . aspirin  81 mg Oral Daily  . bisoprolol  2.5 mg Oral Daily  . ceFEPime (MAXIPIME) IV  1 g Intravenous Q24H  . docusate sodium  100 mg Oral Daily  . [START ON 03/23/2014] enoxaparin (LOVENOX) injection  70 mg Subcutaneous Q24H  . feeding supplement (ENSURE)  1 Container Oral TID WC  . guaiFENesin  600 mg Oral BID  . ipratropium-albuterol  3 mL Nebulization TID  . levothyroxine  175 mcg Oral QAC breakfast  . patient's guide to using coumadin book   Does not apply Once  . polyethylene glycol  17 g Oral Daily  . [START ON 03/23/2014] warfarin  2 mg Oral Once  . warfarin   Does not apply Once  . Warfarin - Pharmacist Dosing Inpatient   Does not apply q1800    Continuous Infusions: . sodium chloride      Past Medical History  Diagnosis Date  . lung ca dx'd 08/2003    chemo/xrt comp 2005; tarceva ongoing  . Hypertension   . Hypothyroidism   . Lung cancer, lower lobe 07/11/2011    History reviewed. No  pertinent past surgical history.  Terrace Arabia RD, LDN

## 2014-03-23 ENCOUNTER — Inpatient Hospital Stay (HOSPITAL_COMMUNITY): Payer: Medicare Other

## 2014-03-23 LAB — PROTIME-INR
INR: 1.96 — ABNORMAL HIGH (ref 0.00–1.49)
Prothrombin Time: 22.3 seconds — ABNORMAL HIGH (ref 11.6–15.2)

## 2014-03-23 LAB — COMPREHENSIVE METABOLIC PANEL
ALT: 104 U/L — ABNORMAL HIGH (ref 0–35)
AST: 85 U/L — AB (ref 0–37)
Albumin: 2.1 g/dL — ABNORMAL LOW (ref 3.5–5.2)
Alkaline Phosphatase: 217 U/L — ABNORMAL HIGH (ref 39–117)
Anion gap: 13 (ref 5–15)
BUN: 46 mg/dL — AB (ref 6–23)
CALCIUM: 8.9 mg/dL (ref 8.4–10.5)
CHLORIDE: 93 meq/L — AB (ref 96–112)
CO2: 21 meq/L (ref 19–32)
Creatinine, Ser: 1.8 mg/dL — ABNORMAL HIGH (ref 0.50–1.10)
GFR calc Af Amer: 28 mL/min — ABNORMAL LOW (ref >90)
GFR calc non Af Amer: 24 mL/min — ABNORMAL LOW (ref >90)
Glucose, Bld: 121 mg/dL — ABNORMAL HIGH (ref 70–99)
POTASSIUM: 5 meq/L (ref 3.7–5.3)
Sodium: 127 mEq/L — ABNORMAL LOW (ref 137–147)
TOTAL PROTEIN: 5.7 g/dL — AB (ref 6.0–8.3)
Total Bilirubin: 0.5 mg/dL (ref 0.3–1.2)

## 2014-03-23 LAB — CBC
HCT: 28.5 % — ABNORMAL LOW (ref 36.0–46.0)
HEMOGLOBIN: 9.3 g/dL — AB (ref 12.0–15.0)
MCH: 27.1 pg (ref 26.0–34.0)
MCHC: 32.6 g/dL (ref 30.0–36.0)
MCV: 83.1 fL (ref 78.0–100.0)
PLATELETS: 270 10*3/uL (ref 150–400)
RBC: 3.43 MIL/uL — AB (ref 3.87–5.11)
RDW: 17.9 % — ABNORMAL HIGH (ref 11.5–15.5)
WBC: 6 10*3/uL (ref 4.0–10.5)

## 2014-03-23 NOTE — Progress Notes (Signed)
Report given to Lorrie from [place]. Medication returned to pt's family

## 2014-03-23 NOTE — Progress Notes (Signed)
[facility] PROGRESS NOTE   MAIREN WALLENSTEIN BPZ:[id number] DOB: [birth year] DOA: 03/16/2014 PCP: Robyn Haber, MD  HPI/Subjective: Seen with son at bedside, multiple questions answered. No changes clinically since yesterday, patient for nursing home. Patient to followup with Dr. Jana Hakim Dr. Melvyn Novas as outpatient  Assessment/Plan: Principal Problem:   HCAP (healthcare-associated pneumonia) Active Problems:   Lung cancer, lower lobe   Hypothyroidism   Hypertension   Anemia, unspecified   Septic shock   Hyponatremia   Abnormal LFTs   Fluid overload   Left arm swelling   Pleural effusion, bilateral    HCAP (healthcare-associated pneumonia)  -The patient is presenting with complaints of cough, shortness of breath, hypotension requiring pressors.  -She was initially admitted at [facility] and now transferred here for further workup.  -Her chest x-ray has been reviewed and shows bilateral basal changes suspicious for pneumonia.  -She will be treated with continuation of her broad-spectrum antibiotic that she was receiving at the [facility].  -I would continue her on IV vancomycin and cefepime.   Acute DVT, involving left upper extremity -Patient presented with left upper extremity swelling, patient has PICC line in the same extremity. -Doppler ultrasound showed acute DVT in the subclavian vein. -She was on heparin drip, switched to Eliquis, now it is contraindicated because of declining renal function. -I will switch to Coumadin with Lovenox bridge.  Acute kidney injury on background of chronic kidney disease -In May creatinine was 1.0, patient creatinine was 1.32 on presentation on 8/5. -Creatinine is slowly but consistently rising, today is 1.89. -Patient has lower extremity edema but likely has intravascular volume depletion because of the hypoalbuminemia. -I will give IV fluids at a slow rate.  Left lung non-small cell cancer    -Patient appears to have worsening effusion in the site of her prior lobectomy.  -Cytology report is not available but she did have increased lymphocytes on her pleural effusion analysis, LDH was 111.  -I will provide her with incentive spirometry and flutter valve for pulmonary toilet.   Left sided pleural effusion -Status post recent thoracentesis and removal of about 1 L of fluids per son. -Increased lymphocytes, LDH was 111 but no cytology. Cytology showed reactive mesothelial cell with acute inflammation. -Per oncology she might need another thoracentesis in the long run or even Pleurx catheter.  Hyponatremia  -Volume overload with third spacing secondary to hypoalbuminemia -The patient appears to have significant volume overload with evidence of bilateral edema and hyponatremia.  -Currently have it all of her labs she may require resumption of her Lasix.  -She recently had received IV fluids resuscitation for her sepsis therefore I would recommend gentle on her diagnosis.  -Monitor ins and outs and daily weight   Abnormal LFT  -Patient does not have any abdominal tenderness  -RUQ ultrasound showed no acute abnormalities apart from cholelithiasis, no Murphy's sign. -Patient probably did have a degree of shocked liver and she was admitted for hypotension which required vasopressors.  Hypothyroidism  -TSH is 12.4, increase Synthroid from 150 to 175, checked in 3-4 weeks.   Code Status: Full code Family Communication: Plan discussed with the patient. Disposition Plan: Remains inpatient   Consultants:  Oncology  Procedures:  None  Antibiotics:  Vancomycin and cefepime   Objective: Filed Vitals:   03/23/14 0449  BP: 112/57  Pulse: 85  Temp: 98.6 F (37 C)  Resp: 18    Intake/Output Summary (Last 24 hours) at 03/23/14 1226 Last data filed at 03/23/14 0700  Gross per 24 hour  Intake 1652.5 ml  Output      0 ml  Net 1652.5 ml   Filed Weights   03/21/14 0512  03/22/14 0457 03/23/14 0449  Weight: 71.215 kg (157 lb) 70.308 kg (155 lb) 71.668 kg (158 lb)    Exam: General: Alert and awake, oriented x3, not in any acute distress. HEENT: anicteric sclera, pupils reactive to light and accommodation, EOMI CVS: S1-S2 clear, no murmur rubs or gallops Chest: clear to auscultation bilaterally, no wheezing, rales or rhonchi Abdomen: soft nontender, nondistended, normal bowel sounds, no organomegaly Extremities: no cyanosis, clubbing or edema noted bilaterally Neuro: Cranial nerves II-XII intact, no focal neurological deficits  Data Reviewed: Basic Metabolic Panel:  Recent Labs Lab 03/17/14 0315  03/19/14 0420 03/20/14 0500 03/21/14 0410 03/22/14 0445 03/23/14 0510  NA 126*  < > 126* 125* 126* 130* 127*  K 5.3  < > 5.5* 5.7* 5.4* 5.0 5.0  CL 96  < > 95* 94* 93* 94* 93*  CO2 20  < > 19 19 21 21 21   GLUCOSE 113*  < > 160* 117* 117* 113* 121*  BUN 31*  < > 35* 39* 41* 45* 46*  CREATININE 1.32*  < > 1.62* 1.72* 1.77* 1.89* 1.80*  CALCIUM 8.9  < > 9.1 9.3 9.3 9.0 8.9  MG 2.1  --   --   --   --   --   --   PHOS 2.5  --   --   --   --   --   --   < > = values in this interval not displayed. Liver Function Tests:  Recent Labs Lab 03/19/14 0420 03/20/14 0500 03/21/14 0410 03/22/14 0445 03/23/14 0510  AST 39* 26 80* 110* 85*  ALT 84* 60* 80* 100* 104*  ALKPHOS 206* 192* 204* 204* 217*  BILITOT 0.7 0.5 0.4 0.3 0.5  PROT 5.5* 5.6* 5.7* 5.4* 5.7*  ALBUMIN 2.1* 2.1* 2.0* 2.0* 2.1*   No results found for this basename: LIPASE, AMYLASE,  in the last 168 hours No results found for this basename: AMMONIA,  in the last 168 hours CBC:  Recent Labs Lab 03/17/14 0110 03/23/14 0510  WBC 5.3 6.0  NEUTROABS 4.2  --   HGB 9.3* 9.3*  HCT 27.6* 28.5*  MCV 82.6 83.1  PLT 230 270   Cardiac Enzymes: No results found for this basename: CKTOTAL, CKMB, CKMBINDEX, TROPONINI,  in the last 168 hours BNP (last 3 results)  Recent Labs  03/17/14 0105  03/21/14 0410  PROBNP 2029.0* 3211.0*   CBG: No results found for this basename: GLUCAP,  in the last 168 hours  Micro Recent Results (from the past 240 hour(s))  BODY FLUID CULTURE     Status: None   Collection Time    03/18/14 12:01 PM      Result Value Ref Range Status   Specimen Description PLEURAL   Final   Special Requests Normal   Final   Gram Stain     Final   Value: NO WBC SEEN     NO SQUAMOUS EPITHELIAL CELLS SEEN     NO ORGANISMS SEEN     Performed at [facility]   Culture     Final   Value: NO GROWTH 3 DAYS     Performed at [facility]   Report Status 03/21/2014 FINAL   Final     Studies: No results found.  Scheduled Meds: . aspirin  81 mg  Oral Daily  . bisoprolol  2.5 mg Oral Daily  . ceFEPime (MAXIPIME) IV  1 g Intravenous Q24H  . docusate sodium  100 mg Oral Daily  . enoxaparin (LOVENOX) injection  70 mg Subcutaneous Q24H  . feeding supplement (ENSURE)  1 Container Oral TID WC  . guaiFENesin  600 mg Oral BID  . ipratropium-albuterol  3 mL Nebulization TID  . levothyroxine  175 mcg Oral QAC breakfast  . polyethylene glycol  17 g Oral Daily  . Warfarin - Pharmacist Dosing Inpatient   Does not apply q1800   Continuous Infusions: . sodium chloride 75 mL/hr at 03/23/14 0310       Time spent: 35 minutes    [facility]  [facility] Pager [phone] If 7PM-7AM, please contact night-coverage at [email or web address], password [facility] 03/23/2014, 12:26 PM  LOS: 7 days

## 2014-03-23 NOTE — Discharge Instructions (Signed)
Information on my medicine - Coumadin   (Warfarin)  This medication education was reviewed with me or my healthcare representative as part of my discharge preparation.  The pharmacist that spoke with me during my hospital stay was:  Emiliano Dyer, [facility]  Why was Coumadin prescribed for you? Coumadin was prescribed for you because you have a blood clot or a medical condition that can cause an increased risk of forming blood clots. Blood clots can cause serious health problems by blocking the flow of blood to the heart, lung, or brain. Coumadin can prevent harmful blood clots from forming. As a reminder your indication for Coumadin is:   Select from menu  What test will check on my response to Coumadin? While on Coumadin (warfarin) you will need to have an INR test regularly to ensure that your dose is keeping you in the desired range. The INR (international normalized ratio) number is calculated from the result of the laboratory test called prothrombin time (PT).  If an INR APPOINTMENT HAS NOT ALREADY BEEN MADE FOR YOU please schedule an appointment to have this lab work done by your health care provider within 7 days. Your INR goal is usually a number between:  2 to 3 or your provider may give you a more narrow range like 2-2.5.  Ask your health care provider during an office visit what your goal INR is.  What  do you need to  know  About  COUMADIN? Take Coumadin (warfarin) exactly as prescribed by your healthcare provider about the same time each day.  DO NOT stop taking without talking to the doctor who prescribed the medication.  Stopping without other blood clot prevention medication to take the place of Coumadin may increase your risk of developing a new clot or stroke.  Get refills before you run out.  What do you do if you miss a dose? If you miss a dose, take it as soon as you remember on the same day then continue your regularly scheduled regimen the next day.  Do not take two doses of  Coumadin at the same time.  Important Safety Information A possible side effect of Coumadin (Warfarin) is an increased risk of bleeding. You should call your healthcare provider right away if you experience any of the following:   Bleeding from an injury or your nose that does not stop.   Unusual colored urine (red or dark brown) or unusual colored stools (red or black).   Unusual bruising for unknown reasons.   A serious fall or if you hit your head (even if there is no bleeding).  Some foods or medicines interact with Coumadin (warfarin) and might alter your response to warfarin. To help avoid this:   Eat a balanced diet, maintaining a consistent amount of Vitamin K.   Notify your provider about major diet changes you plan to make.   Avoid alcohol or limit your intake to 1 drink for women and 2 drinks for men per day. (1 drink is 5 oz. wine, 12 oz. beer, or 1.5 oz. liquor.)  Make sure that ANY health care provider who prescribes medication for you knows that you are taking Coumadin (warfarin).  Also make sure the healthcare provider who is monitoring your Coumadin knows when you have started a new medication including herbals and non-prescription products.  Coumadin (Warfarin)  Major Drug Interactions  Increased Warfarin Effect Decreased Warfarin Effect  Alcohol (large quantities) Antibiotics (esp. Septra/Bactrim, Flagyl, Cipro) Amiodarone (Cordarone) Aspirin (ASA) Cimetidine (Tagamet)  Megestrol (Megace) NSAIDs (ibuprofen, naproxen, etc.) Piroxicam (Feldene) Propafenone (Rythmol SR) Propranolol (Inderal) Isoniazid (INH) Posaconazole (Noxafil) Barbiturates (Phenobarbital) Carbamazepine (Tegretol) Chlordiazepoxide (Librium) Cholestyramine (Questran) Griseofulvin Oral Contraceptives Rifampin Sucralfate (Carafate) Vitamin K   Coumadin (Warfarin) Major Herbal Interactions  Increased Warfarin Effect Decreased Warfarin Effect  Garlic Ginseng Ginkgo biloba Coenzyme Q10 Green  tea St. Johns wort    Coumadin (Warfarin) FOOD Interactions  Eat a consistent number of servings per week of foods HIGH in Vitamin K (1 serving =  cup)  Collards (cooked, or boiled & drained) Kale (cooked, or boiled & drained) Mustard greens (cooked, or boiled & drained) Parsley *serving size only =  cup Spinach (cooked, or boiled & drained) Swiss chard (cooked, or boiled & drained) Turnip greens (cooked, or boiled & drained)  Eat a consistent number of servings per week of foods MEDIUM-HIGH in Vitamin K (1 serving = 1 cup)  Asparagus (cooked, or boiled & drained) Broccoli (cooked, boiled & drained, or raw & chopped) Brussel sprouts (cooked, or boiled & drained) *serving size only =  cup Lettuce, raw (green leaf, endive, romaine) Spinach, raw Turnip greens, raw & chopped   These websites have more information on Coumadin (warfarin):  FailFactory.se; VeganReport.com.au;

## 2014-03-23 NOTE — Progress Notes (Addendum)
ANTICOAGULATION CONSULT NOTE - Follow Up Consult  Pharmacy Consult for Lovenox / Warfarin Indication: Acute DVT  Allergies  Allergen Reactions  . Keflex [Cephalexin] Rash  . Sulfa Antibiotics Nausea And Vomiting  . Other     Pt states antibiotics--unable to remember names of meds  . Paroxetine Hcl Anxiety  . Ramipril [Ramipril] Rash and Cough  . Sertraline Anxiety    Patient Measurements: Height: 5\' 5"  (165.1 cm) Weight: 158 lb (71.668 kg) IBW/kg (Calculated) : 57  Vital Signs: Temp: 98.6 F (37 C) (08/11 0449) Temp src: Oral (08/11 0449) BP: 112/57 mmHg (08/11 0449) Pulse Rate: 85 (08/11 0449)  Labs:  Recent Labs  03/21/14 0410 03/22/14 0445 03/23/14 0510  HGB  --   --  9.3*  HCT  --   --  28.5*  PLT  --   --  270  LABPROT  --   --  22.3*  INR  --   --  1.96*  CREATININE 1.77* 1.89* 1.80*    Estimated Creatinine Clearance: 22.3 ml/min (by C-G formula based on Cr of 1.8).   Medications:  Scheduled:  . aspirin  81 mg Oral Daily  . bisoprolol  2.5 mg Oral Daily  . ceFEPime (MAXIPIME) IV  1 g Intravenous Q24H  . docusate sodium  100 mg Oral Daily  . enoxaparin (LOVENOX) injection  70 mg Subcutaneous Q24H  . feeding supplement (ENSURE)  1 Container Oral TID WC  . guaiFENesin  600 mg Oral BID  . ipratropium-albuterol  3 mL Nebulization TID  . levothyroxine  175 mcg Oral QAC breakfast  . polyethylene glycol  17 g Oral Daily  . warfarin  2 mg Oral Once  . Warfarin - Pharmacist Dosing Inpatient   Does not apply q1800   Infusions:  . sodium chloride 75 mL/hr at 03/23/14 0310   PRN: meclizine, oxyCODONE, zolpidem  Assessment: 27 yof with PMH of NSCLC s/p lobectomy in 2005 with adjuvent chemo and radiation on Tarceva since 2006, hypothyroidism, HTN, CHF. Venus duplex revealed left upper extremity DVT. Pharmacy consulted to dose IV heparin on 8/5, then transitioned to apixaban on 8/7.  SCr has continued to rise during this admission, with CrCl ~ 20 ml/min.  Clinical trials evaluating apixaban for acute treatment of VTE did NOT include patients with CrCl < 25 ml/min. Therefore, pharmacy consulted to change from apixaban to warfarin with lovenox bridge.  Note rising LFTs: AST = 110, ALT = 100, AlkPhos = 204, albumin = 2.0. Slightly improved today. Baseline INR = 1.15 on 8/5, now 1.96 today but this is skewed by recent apixaban therapy CBC: stable since admission  No drug interactions currently, other than concurrent aspirin 81mg  daily.  Multiple bruises noted on arms and forehead  Goal of Therapy:  INR 2-3 Monitor platelets by anticoagulation protocol: Yes   Plan:   At 10AM today, begin Lovenox 70mg  (1mg /kg) sq q24h (for CrCl < 30 ml/min)  Begin Warfarin 2 mg and check INR daily until stable. Will most likely require < 2.5mg  daily given age, LFTs and declining renal function.  DC Lovenox once INR therapeutic. Do not anticipate that she will need a 5 day overlap since she has already been on full-dose anticoagulation since 8/5.  Note that planning to discharge on levaquin which is likely to increase warfarin effects - please monitor INR daily and watch closely for bleeding. Educated patient and son regarding warfarin indication, adverse effects, importance of INR monitoring, food & drug interactions. They had a good  level of understanding.   Peggyann Juba, PharmD, BCPS Pager: [phone] 03/23/2014,10:37 AM

## 2014-03-23 NOTE — Progress Notes (Signed)
Patient is set to discharge to [facility] today. Patient & son, Bobby Rumpf aware. Discharge packet given to RN, Myrna. [facility] called for transport.   Clinical Social Work Department CLINICAL SOCIAL WORK PLACEMENT NOTE 03/23/2014  Patient:  [name], [name]  Account Number:  [id number] Admit date:  03/16/2014  Clinical Social Worker:  Renold Genta  Date/time:  03/19/2014 12:19 PM  Clinical Social Work is seeking post-discharge placement for this patient at the following level of care:   SKILLED NURSING   (*CSW will update this form in Epic as items are completed)   03/19/2014  Patient/family provided with [place] Department of Clinical Social Work's list of facilities offering this level of care within the geographic area requested by the patient (or if unable, by the patient's family).  03/19/2014  Patient/family informed of their freedom to choose among providers that offer the needed level of care, that participate in Medicare, Medicaid or managed care program needed by the patient, have an available bed and are willing to accept the patient.  03/19/2014  Patient/family informed of [facility]' ownership interest in [facility], as well as of the fact that they are under no obligation to receive care at this facility.  PASARR submitted to EDS on 03/19/2014 PASARR number received on 03/19/2014  FL2 transmitted to all facilities in geographic area requested by pt/family on  03/19/2014 FL2 transmitted to all facilities within larger geographic area on   Patient informed that his/her managed care company has contracts with or will negotiate with  certain facilities, including the following:     Patient/family informed of bed offers received:  03/19/2014 Patient chooses bed at [facility] [place] Physician recommends and patient chooses bed at    Patient to be transferred to [place] on   03/23/2014 Patient to be transferred to facility by [facility] Patient and family notified of transfer on 03/23/2014 Name of family member notified:  patient's son, Lewis at bedside  The following physician request were entered in Epic:   Additional Comments:   Raynaldo Opitz, [place] Social Worker cell #: [phone]

## 2014-03-23 NOTE — Discharge Summary (Addendum)
Physician Discharge Summary  [name] YQM:[id number] DOB: 1925/07/22 DOA: 03/16/2014  PCP: Robyn Haber, MD  Admit date: 03/16/2014 Discharge date: 03/23/2014  Time spent: 40 minutes  Recommendations for Outpatient Follow-up:  1. Followup with the nursing home physician. Needs followup with pulmonology as outpatient within one week. 2. Started Lovenox 70 mg and Coumadin 2 mg by mouth daily. 3. Continue Lovenox until Coumadin therapeutic, continue Lovenox for at least 5 days. 4. Patient will be on Levaquin which likely to interact with Coumadin, check INR daily. 5. Follow BMP closely, off of Lasix. 6. CXR done prior to discharge and showed some re-accumulation of pleural effusion, she will probably needs the to be restarted soon.  Discharge Diagnoses:  Principal Problem:   HCAP (healthcare-associated pneumonia) Active Problems:   Lung cancer, lower lobe   Hypothyroidism   Hypertension   Anemia, unspecified   Septic shock   Hyponatremia   Abnormal LFTs   Fluid overload   Left arm swelling   Pleural effusion, bilateral   Discharge Condition: Stable  Diet recommendation: Heart healthy  Filed Weights   03/21/14 0512 03/22/14 0457 03/23/14 0449  Weight: 71.215 kg (157 lb) 70.308 kg (155 lb) 71.668 kg (158 lb)    History of present illness:  [name] is a 80 y.o. female with Past medical history of non-small cell lung cancer status post lobectomy in 2005 with adjuvant chemotherapy and radiation on Tarceva at present since 2006, hypothyroidism, hypertension, CHF.  The patient is 80 year old female with past medical history of non-small cell lung cancer. She has chronic mild to moderate size pleural effusion on the left. She was seen here in May 15 at which time she had mild shortness of breath with exertion which was stable. Since last few weeks she has been having progressively worsening shortness of breath along with cough. She also had some sputum  production. She was started on antibiotic levofloxacin in the end of July 15. A chest x-ray was performed which showed moderate pleural effusion and she had a thoracentesis with 1.1 L removed. After the procedure when she was at home she had a fall with hypotension per EMS. On further questioning she mentioned that she had similar falls when she stumbled on things twice in that week.  She was initially seen at [place].  Initially she was admitted ICU. She was given multiple IV bolus normal saline. Initially a centerline and later on left-sided PICC line was placed. She was also initially given phenylephrine for her shock.  A chest x-ray was performed which was showing reaccumulation of pleural fluid. Patient was covered with broad-spectrum antibiotics vancomycin and cefepime.  Earlier today she was found to have worsening hyponatremia.  Pulmonary consultation was done today recommended the patient to be transferred to [facility] long hospital by patient's primary oncologist is available and the transfer was made and patient physician's request.  At the time of my evaluation patient mentions that she has been feeling significantly better than her initial arrival on 03/12/2014 but she still continues to have cough with yellow-green expectoration, shortness of breath, significant fatigue and generalized weakness. She denies any chest pain fever chills nausea vomiting abdominal pain diarrhea constipation burning urination.  She has noted some swelling of her leg but denies any leg tenderness.  Hospital Course:   HCAP (healthcare-associated pneumonia)  -The patient is presenting with complaints of cough, shortness of breath, hypotension requiring pressors.  -She was initially admitted at [facility] and now  transferred here for further workup.  -Her chest x-ray has been reviewed and shows bilateral basal changes suspicious for pneumonia.  -She will be treated with  continuation of her broad-spectrum antibiotic that she was receiving at the [facility].  -She was treated with IV vancomycin and Rocephin while she is in the hospital for 6 days. -On the day of discharge antibiotics switched to levofloxacin 750 mg for 5 more days.  Acute DVT, involving left upper extremity  -Patient presented with left upper extremity swelling, patient has PICC line in the same extremity.  -Doppler ultrasound showed acute DVT in the subclavian vein.  -She was on heparin drip, switched to Eliquis, now it is contraindicated because of declining renal function.  -Patient switched to Coumadin with Lovenox bridge on discharge.   Acute kidney injury on background of chronic kidney disease  -In May creatinine was 1.0, patient creatinine was 1.32 on presentation on 8/5.  -Creatinine is slowly but consistently rising, today is 1.89.  -Patient has lower extremity edema but likely has intravascular volume depletion because of the hypoalbuminemia.  -I discontinued the Lasix at the time of discharge.  Left lung non-small cell cancer  -Patient appears to have worsening effusion in the site of her prior lobectomy.  -Cytology report is not available but she did have increased lymphocytes on her pleural effusion analysis, LDH was 111.  -I will provide her with incentive spirometry and flutter valve for pulmonary toilet.   Left sided pleural effusion  -Status post recent thoracentesis and removal of about 1 L of fluids per son (in [place]).  This is repeated and was long hospital with removal of 1.2 L of fluids. -Increased lymphocytes, LDH was 111 but no cytology. Cytology showed reactive mesothelial cell with acute inflammation.  -Per oncology she might need another thoracentesis in the long run or even Pleurx catheter.  -Pulmonology recommended to discontinue amlodipine and start beta blockers, hopefully will help to decrease pulmonary artery pressure as well. -Patient is  followup with pulmonology as outpatient.  Hyponatremia  -Volume overload with third spacing secondary to hypoalbuminemia  -The patient appears to have significant third spacing with evidence of bilateral edema and hyponatremia.   -Monitor ins and outs and daily weight   Abnormal LFT  -Patient does not have any abdominal tenderness  -RUQ ultrasound showed no acute abnormalities apart from cholelithiasis, no Murphy's sign.  -Patient probably did have a degree of shocked liver and she was admitted for hypotension which required vasopressors.   Hypothyroidism  -TSH is 12.4, increase Synthroid from 112 to 137, check in 3-4 weeks.   Procedures:  Left Thoracentesis and removal of 1.2 L of fluids done on 8.6.15  Consultations:  Oncology.  PCCM.  Interventional radio  Discharge Exam: Filed Vitals:   03/23/14 0449  BP: 112/57  Pulse: 85  Temp: 98.6 F (37 C)  Resp: 18   General: Alert and awake, oriented x3, not in any acute distress. HEENT: anicteric sclera, pupils reactive to light and accommodation, EOMI CVS: S1-S2 clear, no murmur rubs or gallops Chest: clear to auscultation bilaterally, no wheezing, rales or rhonchi Abdomen: soft nontender, nondistended, normal bowel sounds, no organomegaly Extremities: no cyanosis, clubbing or edema noted bilaterally Neuro: Cranial nerves II-XII intact, no focal neurological deficits  Discharge Instructions You were cared for by a hospitalist during your hospital stay. If you have any questions about your discharge medications or the care you received while you were in the hospital after you are discharged, you  can call the unit and asked to speak with the hospitalist on call if the hospitalist that took care of you is not available. Once you are discharged, your primary care physician will handle any further medical issues. Please note that NO REFILLS for any discharge medications will be authorized once you are discharged, as it is  imperative that you return to your primary care physician (or establish a relationship with a primary care physician if you do not have one) for your aftercare needs so that they can reassess your need for medications and monitor your lab values.      Discharge Instructions   Diet - low sodium heart healthy    Complete by:  As directed      Increase activity slowly    Complete by:  As directed             Medication List    STOP taking these medications       amLODipine 5 MG tablet  Commonly known as:  NORVASC     erlotinib 100 MG tablet  Commonly known as:  TARCEVA     furosemide 20 MG tablet  Commonly known as:  LASIX     ibuprofen 200 MG tablet  Commonly known as:  ADVIL,MOTRIN      TAKE these medications       aspirin 81 MG tablet  Take 81 mg by mouth daily.     bisoprolol 5 MG tablet  Commonly known as:  ZEBETA  Take 0.5 tablets (2.5 mg total) by mouth daily.     CALCIUM + D PO  Take 1 tablet by mouth daily.     enoxaparin 80 MG/0.8ML injection  Commonly known as:  LOVENOX  Inject 0.7 mLs (70 mg total) into the skin daily.     ferrous gluconate 324 MG tablet  Commonly known as:  FERGON  Take 324 mg by mouth daily with breakfast.     levofloxacin 750 MG tablet  Commonly known as:  LEVAQUIN  Take 1 tablet (750 mg total) by mouth daily.     levothyroxine 137 MCG tablet  Commonly known as:  SYNTHROID, LEVOTHROID  Take 1 tablet (137 mcg total) by mouth daily before breakfast.     loratadine 10 MG tablet  Commonly known as:  CLARITIN  Take 10 mg by mouth daily.     Magnesium 100 MG Caps  Take 1 capsule by mouth daily.     meclizine 12.5 MG tablet  Commonly known as:  ANTIVERT  Take 1 tablet (12.5 mg total) by mouth 3 (three) times daily as needed for dizziness.     metroNIDAZOLE 0.75 % cream  Commonly known as:  METROCREAM  Apply 1 application topically 2 (two) times daily as needed (rosacea).     polyethylene glycol packet  Commonly known as:   MIRALAX / GLYCOLAX  Take 17 g by mouth daily as needed for mild constipation.     vitamin B-12 1000 MCG tablet  Commonly known as:  CYANOCOBALAMIN  Take 1,000 mcg by mouth daily.     vitamin C 500 MG tablet  Commonly known as:  ASCORBIC ACID  Take 500 mg by mouth daily.     warfarin 2 MG tablet  Commonly known as:  COUMADIN  Take 1 tablet (2 mg total) by mouth once.     zolpidem 10 MG tablet  Commonly known as:  AMBIEN  Take 2.5-5 mg by mouth at bedtime as needed for sleep.  Allergies  Allergen Reactions  . Keflex [Cephalexin] Rash  . Sulfa Antibiotics Nausea And Vomiting  . Other     Pt states antibiotics--unable to remember names of meds  . Paroxetine Hcl Anxiety  . Ramipril [Ramipril] Rash and Cough  . Sertraline Anxiety      The results of significant diagnostics from this hospitalization (including imaging, microbiology, ancillary and laboratory) are listed below for reference.    Significant Diagnostic Studies: Dg Chest 1 View  03/18/2014   CLINICAL DATA:  Lung cancer. Left pleural effusion. Status post left thoracentesis.  EXAM: CHEST - 1 VIEW  COMPARISON:  CT scan dated 03/17/2014  FINDINGS: There has been marked reduction in the large left pleural effusion with improved aeration in the left lung. There is a moderate right effusion with secondary compressive atelectasis. Pulmonary vascularity is normal. Scarring in the left hilum.  IMPRESSION: No pneumothorax after left thoracentesis. Marked reduction in left effusion. Persistent moderate right effusion.   Electronically Signed   By: Rozetta Nunnery M.D.   On: 03/18/2014 12:32   Ct Angio Chest Pe W/cm &/or Wo Cm  03/17/2014   CLINICAL DATA:  Shortness of breath and weakness.  EXAM: CT ANGIOGRAPHY CHEST WITH CONTRAST  TECHNIQUE: Multidetector CT imaging of the chest was performed using the standard protocol during bolus administration of intravenous contrast. Multiplanar CT image reconstructions and MIPs were obtained  to evaluate the vascular anatomy.  CONTRAST:  130mL OMNIPAQUE IOHEXOL 350 MG/ML SOLN  COMPARISON:  Chest x-ray 01/06/2014.  CT 03/07/2011.  FINDINGS: Thoracic aorta is atherosclerotic. No aneurysm. No dissection. Great vessels are patent. Pulmonary arteries are normal. Coronary artery disease. Moderate pericardial effusion.  Mediastinum difficult to evaluate due to the pericardial effusion and large pleural effusions. The thoracic esophagus is slightly dilated.  Narrowing of both mainstem bronchi noted secondary to the large pleural effusions. Basilar atelectasis. Mild diffuse interstitial prominence. A component of interstitial edema cannot be excluded. Very large bilateral pleural effusions. No pneumothorax.  Thyroid region unremarkable. Shotty axillary lymph nodes. Diffuse anasarca. Prominent degenerative changes thoracic spine.  Review of the MIP images confirms the above findings.  IMPRESSION: 1. No evidence pulmonary embolus. 2. Moderate pericardial effusion. 3. Coronary artery disease. 4. Severe bilateral pleural effusions. Basilar atelectasis. Component of interstitial edema cannot be excluded. Diffuse anasarca. 5. Thoracic esophagus slightly dilated.   Electronically Signed   By: Marcello Moores  Register   On: 03/17/2014 17:27   US Abdomen Limited Ruq  03/17/2014   CLINICAL DATA:  Elevated liver function tests  EXAM: US ABDOMEN LIMITED - RIGHT UPPER QUADRANT  COMPARISON:  None.  FINDINGS: Gallbladder:  Multiple gallstones. The largest is 7 mm. No wall thickening. Negative Murphy sign.  Common bile duct:  Diameter: 4.2 mm in caliber.  Liver:  1.1 x 1.1 x 1.1 cm homogeneous hyperechoic lesion in the lateral segment of the left lobe of the liver. Otherwise, there is diffuse homogeneous echogenicity within the liver.  Additional findings:  Right pleural effusion.  IMPRESSION: Cholelithiasis.  1.1 cm hyperechoic lesion in the lateral segment of the left lobe of the liver. This is most likely benign. Followup  ultrasound in 6 months is recommended to ensure stability. If the patient has high risk of malignancy or has a history of malignancy, MRI is recommended.  Right pleural effusion.   Electronically Signed   By: Maryclare Bean M.D.   On: 03/17/2014 08:41   US Thoracentesis Asp Pleural Space W/img Guide  03/18/2014   CLINICAL DATA:  Patient with remote history of lung carcinoma, dyspnea, bilateral pleural effusions. Request is made for diagnostic and therapeutic left thoracentesis.  EXAM: ULTRASOUND GUIDED DIAGNOSTIC AND THERAPEUTIC LEFT THORACENTESIS  COMPARISON:  None.  PROCEDURE: An ultrasound guided thoracentesis was thoroughly discussed with the patient and questions answered. The benefits, risks, alternatives and complications were also discussed. The patient understands and wishes to proceed with the procedure. Written consent was obtained.  Ultrasound was performed to localize and mark an adequate pocket of fluid in the left chest. The area was then prepped and draped in the normal sterile fashion. 1% Lidocaine was used for local anesthesia. Under ultrasound guidance a 19 gauge Yueh catheter was introduced. Thoracentesis was performed. The catheter was removed and a dressing applied.  Complications:  None.  FINDINGS: A total of approximately 1.2 liters of slightly turbid, amber fluid was removed. The fluid sample wassent for laboratory analysis.  IMPRESSION: Successful ultrasound guided diagnostic and therapeutic left thoracentesis yielding 1.2 liters of pleural fluid.  Read by: Rowe Robert, PA-C   Electronically Signed   By: Maryclare Bean M.D.   On: 03/18/2014 13:54    Microbiology: Recent Results (from the past 240 hour(s))  BODY FLUID CULTURE     Status: None   Collection Time    03/18/14 12:01 PM      Result Value Ref Range Status   Specimen Description PLEURAL   Final   Special Requests Normal   Final   Gram Stain     Final   Value: NO WBC SEEN     NO SQUAMOUS EPITHELIAL CELLS SEEN     NO ORGANISMS  SEEN     Performed at [facility]   Culture     Final   Value: NO GROWTH 3 DAYS     Performed at [facility]   Report Status 03/21/2014 FINAL   Final     Labs: Basic Metabolic Panel:  Recent Labs Lab 03/17/14 0315  03/19/14 0420 03/20/14 0500 03/21/14 0410 03/22/14 0445 03/23/14 0510  NA 126*  < > 126* 125* 126* 130* 127*  K 5.3  < > 5.5* 5.7* 5.4* 5.0 5.0  CL 96  < > 95* 94* 93* 94* 93*  CO2 20  < > 19 19 21 21 21   GLUCOSE 113*  < > 160* 117* 117* 113* 121*  BUN 31*  < > 35* 39* 41* 45* 46*  CREATININE 1.32*  < > 1.62* 1.72* 1.77* 1.89* 1.80*  CALCIUM 8.9  < > 9.1 9.3 9.3 9.0 8.9  MG 2.1  --   --   --   --   --   --   PHOS 2.5  --   --   --   --   --   --   < > = values in this interval not displayed. Liver Function Tests:  Recent Labs Lab 03/19/14 0420 03/20/14 0500 03/21/14 0410 03/22/14 0445 03/23/14 0510  AST 39* 26 80* 110* 85*  ALT 84* 60* 80* 100* 104*  ALKPHOS 206* 192* 204* 204* 217*  BILITOT 0.7 0.5 0.4 0.3 0.5  PROT 5.5* 5.6* 5.7* 5.4* 5.7*  ALBUMIN 2.1* 2.1* 2.0* 2.0* 2.1*   No results found for this basename: LIPASE, AMYLASE,  in the last 168 hours No results found for this basename: AMMONIA,  in the last 168 hours CBC:  Recent Labs Lab 03/17/14 0110 03/23/14 0510  WBC 5.3 6.0  NEUTROABS 4.2  --   HGB 9.3* 9.3*  HCT 27.6* 28.5*  MCV 82.6 83.1  PLT 230 270   Cardiac Enzymes: No results found for this basename: CKTOTAL, CKMB, CKMBINDEX, TROPONINI,  in the last 168 hours BNP: BNP (last 3 results)  Recent Labs  03/17/14 0105 03/21/14 0410  PROBNP 2029.0* 3211.0*   CBG: No results found for this basename: GLUCAP,  in the last 168 hours     Signed:  Ranell Finelli A  [facility] 03/23/2014, 12:28 PM

## 2014-04-26 ENCOUNTER — Other Ambulatory Visit (HOSPITAL_COMMUNITY): Payer: Self-pay | Admitting: Internal Medicine

## 2014-04-26 DIAGNOSIS — D729 Disorder of white blood cells, unspecified: Secondary | ICD-10-CM

## 2014-04-28 ENCOUNTER — Ambulatory Visit (HOSPITAL_COMMUNITY)
Admission: RE | Admit: 2014-04-28 | Discharge: 2014-04-28 | Disposition: A | Discharge status: Home / Self Care | Payer: Medicare Other | Source: Ambulatory Visit | Attending: Internal Medicine | Admitting: Internal Medicine

## 2014-04-28 ENCOUNTER — Ambulatory Visit (HOSPITAL_COMMUNITY)
Admission: RE | Admit: 2014-04-28 | Discharge: 2014-04-28 | Disposition: A | Discharge status: Home / Self Care | Payer: Medicare Other | Source: Ambulatory Visit | Attending: Radiology | Admitting: Radiology

## 2014-04-28 DIAGNOSIS — D729 Disorder of white blood cells, unspecified: Secondary | ICD-10-CM

## 2014-04-28 DIAGNOSIS — D7289 Other specified disorders of white blood cells: Secondary | ICD-10-CM | POA: Insufficient documentation

## 2014-04-28 MED ORDER — LIDOCAINE HCL (PF) 1 % IJ SOLN
INTRAMUSCULAR | Status: AC
  Filled 2014-04-28: qty 10

## 2014-04-28 NOTE — Procedures (Signed)
US guided diagnostic/therapeutic left thoracentesis performed yielding 800 cc's yellow fluid. The fluid was sent to the lab for cytology. F/u CXR pending. No immediate complications.

## 2014-05-14 ENCOUNTER — Telehealth: Payer: Self-pay | Admitting: *Deleted

## 2014-05-14 NOTE — Telephone Encounter (Signed)
This RN received call from [facility] stating pt will be discharged from [place] tomorrow to live with son Bobby Rumpf - in [place].  Per d/c from NH pt will continue under hospice services and is requesting if Dr Jannifer Rodney will be attending.  This RN informed Yvette at [facility] Dr Jannifer Rodney will be attending.  This RN also obtained pt's address in [place] as :  [address]/ [place].

## 2014-05-17 ENCOUNTER — Other Ambulatory Visit: Payer: Self-pay | Admitting: Oncology

## 2014-06-04 ENCOUNTER — Other Ambulatory Visit: Payer: Self-pay | Admitting: *Deleted

## 2014-06-04 DIAGNOSIS — C3430 Malignant neoplasm of lower lobe, unspecified bronchus or lung: Secondary | ICD-10-CM

## 2014-06-07 ENCOUNTER — Ambulatory Visit (HOSPITAL_BASED_OUTPATIENT_CLINIC_OR_DEPARTMENT_OTHER): Payer: Medicare Other | Admitting: Oncology

## 2014-06-07 ENCOUNTER — Other Ambulatory Visit: Payer: Medicare Other

## 2014-06-07 ENCOUNTER — Ambulatory Visit (HOSPITAL_COMMUNITY): Admission: RE | Admit: 2014-06-07 | Payer: Medicare Other | Source: Ambulatory Visit

## 2014-06-07 DIAGNOSIS — C3432 Malignant neoplasm of lower lobe, left bronchus or lung: Secondary | ICD-10-CM

## 2014-06-07 DIAGNOSIS — E871 Hypo-osmolality and hyponatremia: Secondary | ICD-10-CM

## 2014-06-07 DIAGNOSIS — I82622 Acute embolism and thrombosis of deep veins of left upper extremity: Secondary | ICD-10-CM

## 2014-06-07 DIAGNOSIS — N189 Chronic kidney disease, unspecified: Secondary | ICD-10-CM

## 2014-06-08 ENCOUNTER — Ambulatory Visit (HOSPITAL_BASED_OUTPATIENT_CLINIC_OR_DEPARTMENT_OTHER)

## 2014-06-08 ENCOUNTER — Ambulatory Visit (HOSPITAL_COMMUNITY)
Admission: RE | Admit: 2014-06-08 | Discharge: 2014-06-08 | Disposition: A | Discharge status: Home / Self Care | Source: Ambulatory Visit | Attending: Physician Assistant | Admitting: Physician Assistant

## 2014-06-08 DIAGNOSIS — R0602 Shortness of breath: Secondary | ICD-10-CM | POA: Diagnosis not present

## 2014-06-08 DIAGNOSIS — C3430 Malignant neoplasm of lower lobe, unspecified bronchus or lung: Secondary | ICD-10-CM | POA: Insufficient documentation

## 2014-06-08 DIAGNOSIS — R05 Cough: Secondary | ICD-10-CM | POA: Diagnosis not present

## 2014-06-08 DIAGNOSIS — Z85118 Personal history of other malignant neoplasm of bronchus and lung: Secondary | ICD-10-CM

## 2014-06-08 LAB — CBC WITH DIFFERENTIAL/PLATELET
BASO%: 0.5 % (ref 0.0–2.0)
BASOS ABS: 0 10*3/uL (ref 0.0–0.1)
EOS ABS: 0.2 10*3/uL (ref 0.0–0.5)
EOS%: 5.5 % (ref 0.0–7.0)
HCT: 34.4 % — ABNORMAL LOW (ref 34.8–46.6)
HEMOGLOBIN: 10.7 g/dL — AB (ref 11.6–15.9)
LYMPH%: 6 % — ABNORMAL LOW (ref 14.0–49.7)
MCH: 24.8 pg — ABNORMAL LOW (ref 25.1–34.0)
MCHC: 31.1 g/dL — ABNORMAL LOW (ref 31.5–36.0)
MCV: 79.8 fL (ref 79.5–101.0)
MONO#: 0.4 10*3/uL (ref 0.1–0.9)
MONO%: 8 % (ref 0.0–14.0)
NEUT%: 80 % — ABNORMAL HIGH (ref 38.4–76.8)
NEUTROS ABS: 3.5 10*3/uL (ref 1.5–6.5)
Platelets: 216 10*3/uL (ref 145–400)
RBC: 4.31 10*6/uL (ref 3.70–5.45)
RDW: 16 % — ABNORMAL HIGH (ref 11.2–14.5)
WBC: 4.4 10*3/uL (ref 3.9–10.3)
lymph#: 0.3 10*3/uL — ABNORMAL LOW (ref 0.9–3.3)

## 2014-06-08 LAB — COMPREHENSIVE METABOLIC PANEL (CC13)
ALBUMIN: 3.2 g/dL — AB (ref 3.5–5.0)
ALT: 15 U/L (ref 0–55)
ANION GAP: 12 meq/L — AB (ref 3–11)
AST: 22 U/L (ref 5–34)
Alkaline Phosphatase: 140 U/L (ref 40–150)
BUN: 66.7 mg/dL — AB (ref 7.0–26.0)
CO2: 34 meq/L — AB (ref 22–29)
Calcium: 10.3 mg/dL (ref 8.4–10.4)
Chloride: 82 mEq/L — ABNORMAL LOW (ref 98–109)
Creatinine: 1.5 mg/dL — ABNORMAL HIGH (ref 0.6–1.1)
GLUCOSE: 128 mg/dL (ref 70–140)
Potassium: 3.6 mEq/L (ref 3.5–5.1)
SODIUM: 128 meq/L — AB (ref 136–145)
TOTAL PROTEIN: 7.8 g/dL (ref 6.4–8.3)
Total Bilirubin: 0.38 mg/dL (ref 0.20–1.20)

## 2014-06-09 ENCOUNTER — Ambulatory Visit (HOSPITAL_BASED_OUTPATIENT_CLINIC_OR_DEPARTMENT_OTHER): Payer: Medicare Other | Admitting: Oncology

## 2014-06-09 ENCOUNTER — Telehealth: Payer: Self-pay | Admitting: Oncology

## 2014-06-09 VITALS — BP 89/47 | HR 103 | Temp 97.5°F | Resp 18 | Ht 65.0 in | Wt 126.0 lb

## 2014-06-09 DIAGNOSIS — C3432 Malignant neoplasm of lower lobe, left bronchus or lung: Secondary | ICD-10-CM

## 2014-06-09 DIAGNOSIS — N189 Chronic kidney disease, unspecified: Secondary | ICD-10-CM

## 2014-06-09 DIAGNOSIS — I82602 Acute embolism and thrombosis of unspecified veins of left upper extremity: Secondary | ICD-10-CM

## 2014-06-09 DIAGNOSIS — E871 Hypo-osmolality and hyponatremia: Secondary | ICD-10-CM

## 2014-06-09 NOTE — Progress Notes (Signed)
ID: [name]   DOB: [birth year]  MR#: [id number]  XTG#:[id number]  PCP:  Robyn Haber, MD GYN: SU: Other: Christinia Gully, MD  CHIEF COMPLAINT:  Lung Cancer, Left CURRENT TREATMENT: Palliative therapy under hospice care  HISTORY OF PRESENT ILLNESS: From the earlier summary:  [name] had a total right hip under Dr. Gaynelle Arabian in January 2005.  As part of the preoperative work-up, she had a chest x-ray which showed a left lower lobe nodule.  CT of the chest was obtained [name] on 08-12-03) and this was a 3.1 cm. left lower lobe mass concerning for a primary bronchogenic carcinoma.  There was also precarinal and right hilar lymphadenopathy.  The patient was referred to Dr. Christinia Gully for further evaluation and, after appropriate discussion, he set her up for a PET scan and appointment with myself and Dr. Arlyce Dice.  He also set her up for a fine needle aspiration of the left lung mass.  This was performed on 09-24-03 and showed non-small cell lung carcinoma.    The patient's subsequent treatments are as detailed below.   INTERVAL HISTORY: [name] returns today for followup of her lung cancer accompanied by her daughter-in-law [name]. After her admission for pneumonia, [name] was discharged to [facility] where she received physical therapy and other supportive care under the care of hospice for 5 weeks. She then moved in with her son and daughter-in-law. They have changed her dining room to be a room for her so everything is on the same level. She continues under hospice care.   REVIEW OF SYSTEMS:  [name] is tolerating the Coumadin well, with no evidence of bleeding or bruising. She is having some increased problems with phlegm production and some sinus symptoms. This is being treated with Sudafed, Claritin, and mucin NX. She thinks it may be helping. There has been no purulence and no hemoptysis. She denies pleurisy. Her functional status is very limited. She needs assistance  for washing and dressing. She uses a walker at home, but is very unsteady and fell as recently as 2 days ago. Her legs just "jerk away those quotes from under her. She spends most of the day in her recliner. She denies daytime naps, but sometimes apparently she does doze off. She has a little bit of difficulty sleeping at night as a result. She denies headaches, visual changes, nausea, or vomiting. She has normal bowel movements. A detailed review of systems was otherwise stable  PAST MEDICAL HISTORY: Past Medical History  Diagnosis Date  . lung ca dx'd 08/2003    chemo/xrt comp 2005; tarceva ongoing  . Hypertension   . Hypothyroidism   . Lung cancer, lower lobe 07/11/2011  Significant for osteoarthritis, history of depression, history of hypertension, history of hypothyroidism, history of remote right breast biopsy (benign), status post total right hip replacement in January of this year with very good results so far, status post hysterectomy and bilateral salpingo-oophorectomy, status post appendectomy, status post remote exploratory laparotomy for small bowel obstruction, status post tonsillectomy and adenoidectomy, and status post benign skin biopsy.     FAMILY HISTORY The patient's father died from lung cancer at the age of 84.  He lived six months after diagnosis.  He was a heavy smoker.  The patient's mother died at 99-1/2 years.  The patient has one brother who is on hemodialysis, apparently for nephrotic syndrome.    GYNECOLOGIC HISTORY: (Reviewed 01/06/2014) She is P1.  She had her hysterectomy in 1974.  She took  hormones from that time until 11-12-2002    SOCIAL HISTORY: (Updated 01/06/2014) She was an Optometrist.  Her husband died in 1994-11-12, apparently a cardiac death. The patient lives by herself with her "yorkie" dog.  Her son is [name], who works as a Government social research officer for [employer], a Solicitor.  [name], [name],  is a Journalist, newspaper.  [name] and [name] have three sons.  Ms.  Zapanta is a presbyterian.     ADVANCED DIRECTIVES: In place  HEALTH MAINTENANCE: (Updated 01/06/2014) History  Substance Use Topics  . Smoking status: Never Smoker   . Smokeless tobacco: Never Used  . Alcohol Use: No    Colonoscopy: Never  PAP: Status post remote hysterectomy  Bone density: Not on file  Lipid panel: Not on file  Allergies  Allergen Reactions  . Keflex [Cephalexin] Rash  . Sulfa Antibiotics Nausea And Vomiting  . Other     Pt states antibiotics--unable to remember names of meds  . Paroxetine Hcl Anxiety  . Ramipril [Ramipril] Rash and Cough  . Sertraline Anxiety    Current Outpatient Prescriptions  Medication Sig Dispense Refill  . aspirin 81 MG tablet Take 81 mg by mouth daily.        . bisoprolol (ZEBETA) 5 MG tablet Take 0.5 tablets (2.5 mg total) by mouth daily.      . Calcium Carbonate-Vitamin D (CALCIUM + D PO) Take 1 tablet by mouth daily.       Marland Kitchen enoxaparin (LOVENOX) 80 MG/0.8ML injection Inject 0.7 mLs (70 mg total) into the skin daily.      . ferrous gluconate (FERGON) 324 MG tablet Take 324 mg by mouth daily with breakfast.      . levofloxacin (LEVAQUIN) 750 MG tablet Take 1 tablet (750 mg total) by mouth daily.  5 tablet  0  . levothyroxine (SYNTHROID, LEVOTHROID) 137 MCG tablet Take 1 tablet (137 mcg total) by mouth daily before breakfast.      . loratadine (CLARITIN) 10 MG tablet Take 10 mg by mouth daily.        . Magnesium 100 MG CAPS Take 1 capsule by mouth daily.      . meclizine (ANTIVERT) 12.5 MG tablet Take 1 tablet (12.5 mg total) by mouth 3 (three) times daily as needed for dizziness.  30 tablet  0  . metroNIDAZOLE (METROCREAM) 0.75 % cream Apply 1 application topically 2 (two) times daily as needed (rosacea).       . polyethylene glycol (MIRALAX / GLYCOLAX) packet Take 17 g by mouth daily as needed for mild constipation.       . vitamin B-12 (CYANOCOBALAMIN) 1000 MCG tablet Take 1,000 mcg by mouth daily.        . vitamin C (ASCORBIC  ACID) 500 MG tablet Take 500 mg by mouth daily.      Marland Kitchen warfarin (COUMADIN) 2 MG tablet Take 1 tablet (2 mg total) by mouth once.      Marland Kitchen zolpidem (AMBIEN) 10 MG tablet Take 2.5-5 mg by mouth at bedtime as needed for sleep.       No current facility-administered medications for this visit.    OBJECTIVE: Elderly white woman examined in a wheelchair wearing an oxygen cannula connected to a portable oxygen tank Filed Vitals:   06/09/14 0931  BP: 89/47  Pulse: 103  Temp: 97.5 F (36.4 C)  Resp: 18     Body mass index is 20.97 kg/(m^2).    ECOG FS: 3 Filed Weights  06/09/14 0931  Weight: 126 lb (57.153 kg)   Sclerae unicteric, pupils equal, EOMs intact Oropharynx clear and moist-- no thrush or other lesions No cervical or supraclavicular adenopathy Lungs no rales or rhonchi, decreased excursion bilaterally, left worse than right Heart regular rate and rhythm Abd soft, nontender, positive bowel sounds MSK no focal spinal tenderness Neuro: nonfocal, well oriented, appropriate affect Breasts: Deferred  LAB RESULTS: Lab Results  Component Value Date   WBC 4.4 06/08/2014   NEUTROABS 3.5 06/08/2014   HGB 10.7* 06/08/2014   HCT 34.4* 06/08/2014   MCV 79.8 06/08/2014   PLT 216 06/08/2014      Chemistry      Component Value Date/Time   NA 128* 06/08/2014 1004   NA 127* 03/23/2014 0510   NA 137 03/07/2011 0844   K 3.6 06/08/2014 1004   K 5.0 03/23/2014 0510   K 4.1 03/07/2011 0844   CL 93* 03/23/2014 0510   CL 103 11/10/2012 0939   CL 102 03/07/2011 0844   CO2 34* 06/08/2014 1004   CO2 21 03/23/2014 0510   CO2 28 03/07/2011 0844   BUN 66.7* 06/08/2014 1004   BUN 46* 03/23/2014 0510   BUN 15 03/07/2011 0844   CREATININE 1.5* 06/08/2014 1004   CREATININE 1.80* 03/23/2014 0510   CREATININE 1.3* 03/07/2011 0844      Component Value Date/Time   CALCIUM 10.3 06/08/2014 1004   CALCIUM 8.9 03/23/2014 0510   CALCIUM 9.6 03/07/2011 0844   ALKPHOS 140 06/08/2014 1004   ALKPHOS 217*  03/23/2014 0510   ALKPHOS 155* 03/07/2011 0844   AST 22 06/08/2014 1004   AST 85* 03/23/2014 0510   AST 29 03/07/2011 0844   ALT 15 06/08/2014 1004   ALT 104* 03/23/2014 0510   ALT 20 03/07/2011 0844   BILITOT 0.38 06/08/2014 1004   BILITOT 0.5 03/23/2014 0510   BILITOT 0.60 03/07/2011 0844       STUDIES: Dg Chest 2 View  06/08/2014   CLINICAL DATA:  Lung cancer.  Cough and shortness of Breath.  EXAM: CHEST  2 VIEW  COMPARISON:  04/28/2014.  FINDINGS: The cardiac silhouette, mediastinal and hilar contours are stable. Stable radiation fibrosis. There is a recurrent left pleural effusion and a small stable right pleural effusion. No worrisome lung lesions or obvious pulmonary nodules. The bony thorax is intact.  IMPRESSION: Stable radiation changes.  Recurrent small to moderate-sized left pleural effusion. Stable small right pleural effusion.  No obvious pulmonary nodules.   Electronically Signed   By: Kalman Jewels M.D.   On: 06/08/2014 11:53    ASSESSMENT:80 y.o.  [place] woman,   (1)  status post left lower lobectomy in March 2005 for a 3.2 cm, grade 2 lung adenocarcinoma with multiple positive lymph nodes.   (2)  Treated adjuvantly with carboplatin, etoposide, and radiation, completed in August 2005.   (3)  In July 2006, she had multiple bilateral lung lesions documented to be increasing in size. At that time she was started on erlotinib. After a period of stable disease, the erlotinib (Tarceva) was stopped for a three-month period to see if stability persisted. She had definite disease growth during that period and the erlotinib was resumed. Continues now at 100 mg every other day with good tolerance and stable disease according to most recent chest xray on 01/06/2014.  (4) PICC associated LUE DVT documented 03/17/2014  (5) chronic renal injury  PLAN:  [name] will be completing 3 months of anticoagulation next week. This was  on upper extremity DVT related to her PICC line, which has been  removed. Accordingly we can stop the Coumadin next week. She can start aspirin 81 mg at the same time.  She is slightly hyponatremic and of course has chronic renal insufficiency. Possibly the Lasix could be decreased in dose or made every other day, but I do not have an accurate list of her current medications. I have requested that these be faxed to [place] so we can make any adjustments.  [name] does continue to decline. She is using oxygen 24/ 7. There is a little bit more fluid on the left lung. She has some bronchitis/sinusitis issues and this is being treated symptomatically. My hope is that she will remain relatively stable through the holidays. Tentatively I have made her a return appointment with me for January.  Of course [name] should not be resuscitated in case of a terminal event. Chauncey Cruel, MD     06/09/2014

## 2014-06-09 NOTE — Progress Notes (Signed)
See dictated note.

## 2014-09-10 ENCOUNTER — Telehealth: Payer: Self-pay | Admitting: Oncology

## 2014-09-10 NOTE — Telephone Encounter (Signed)
pt daughter Cinda Quest cl;d and CX pt appt.Pt is in [place] and they didnt see the need for pt to come to appt-adv i would note acct

## 2014-09-20 ENCOUNTER — Ambulatory Visit: Payer: Medicare Other | Admitting: Oncology

## 2014-09-20 ENCOUNTER — Other Ambulatory Visit: Payer: Medicare Other

## 2014-10-01 NOTE — Telephone Encounter (Signed)
none

## 2014-10-13 ENCOUNTER — Telehealth: Payer: Self-pay | Admitting: *Deleted

## 2014-10-13 NOTE — Telephone Encounter (Addendum)
VOICE MAIL FROM 12:36PM PT. IS REMAINING AT HER HOME. THIS NOTE TO DR.MAGRINAT'S NURSES, MEREDITH WALTON,RN AND VAL DODD,RN.

## 2015-04-21 NOTE — Progress Notes (Signed)
Received Hospice and Palliative update.  Placed in Dr. Starleen Arms office as an Juluis Rainier then marked for scanning.

## 2015-05-20 IMAGING — CR DG CHEST 2V
2 series · 2 of 2 positions shown · non-contrast
Comparison: Prior radiograph from 03/18/2014

CLINICAL DATA: Left pleural effusion

EXAM:
CHEST  2 VIEW

[w chest lat]
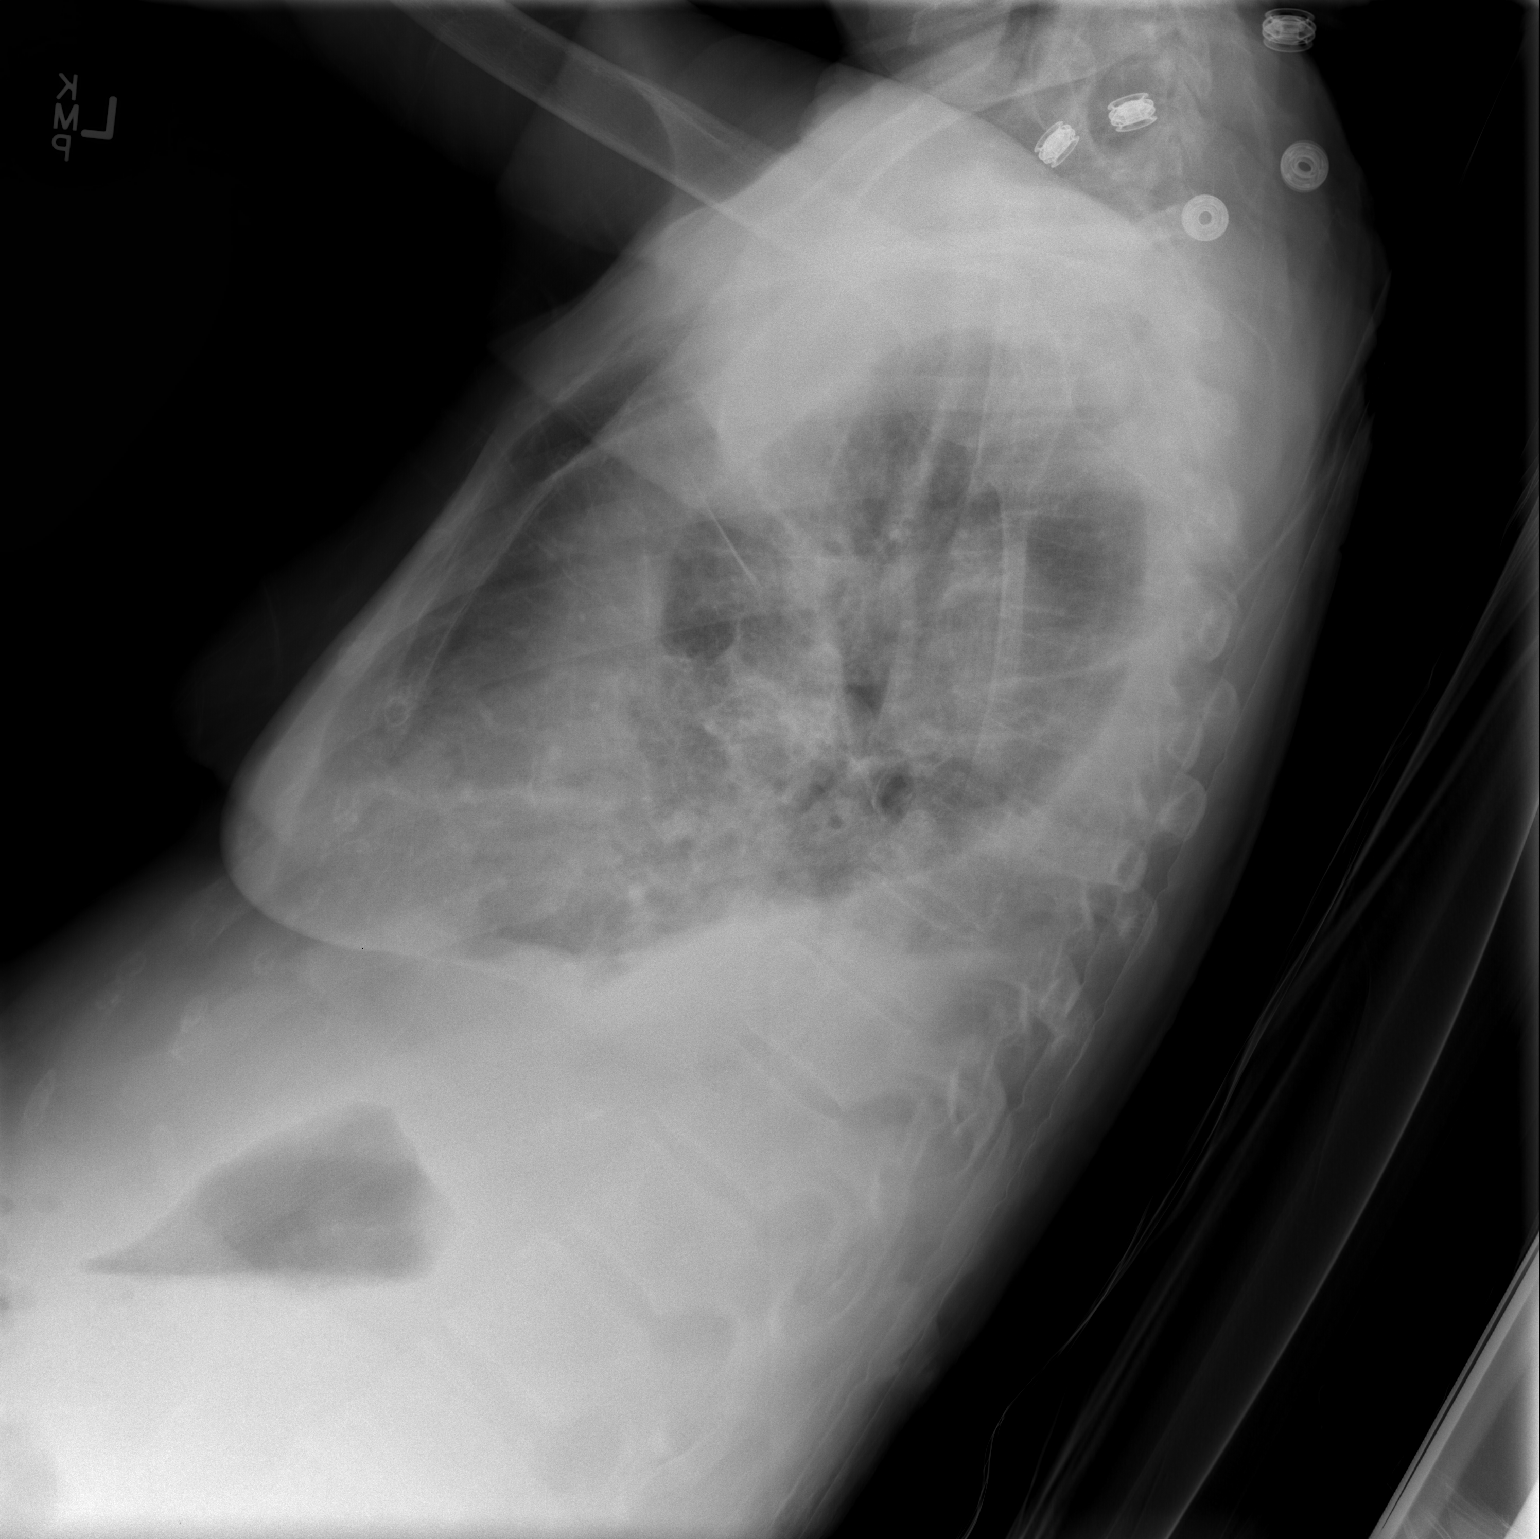

[view not recorded]
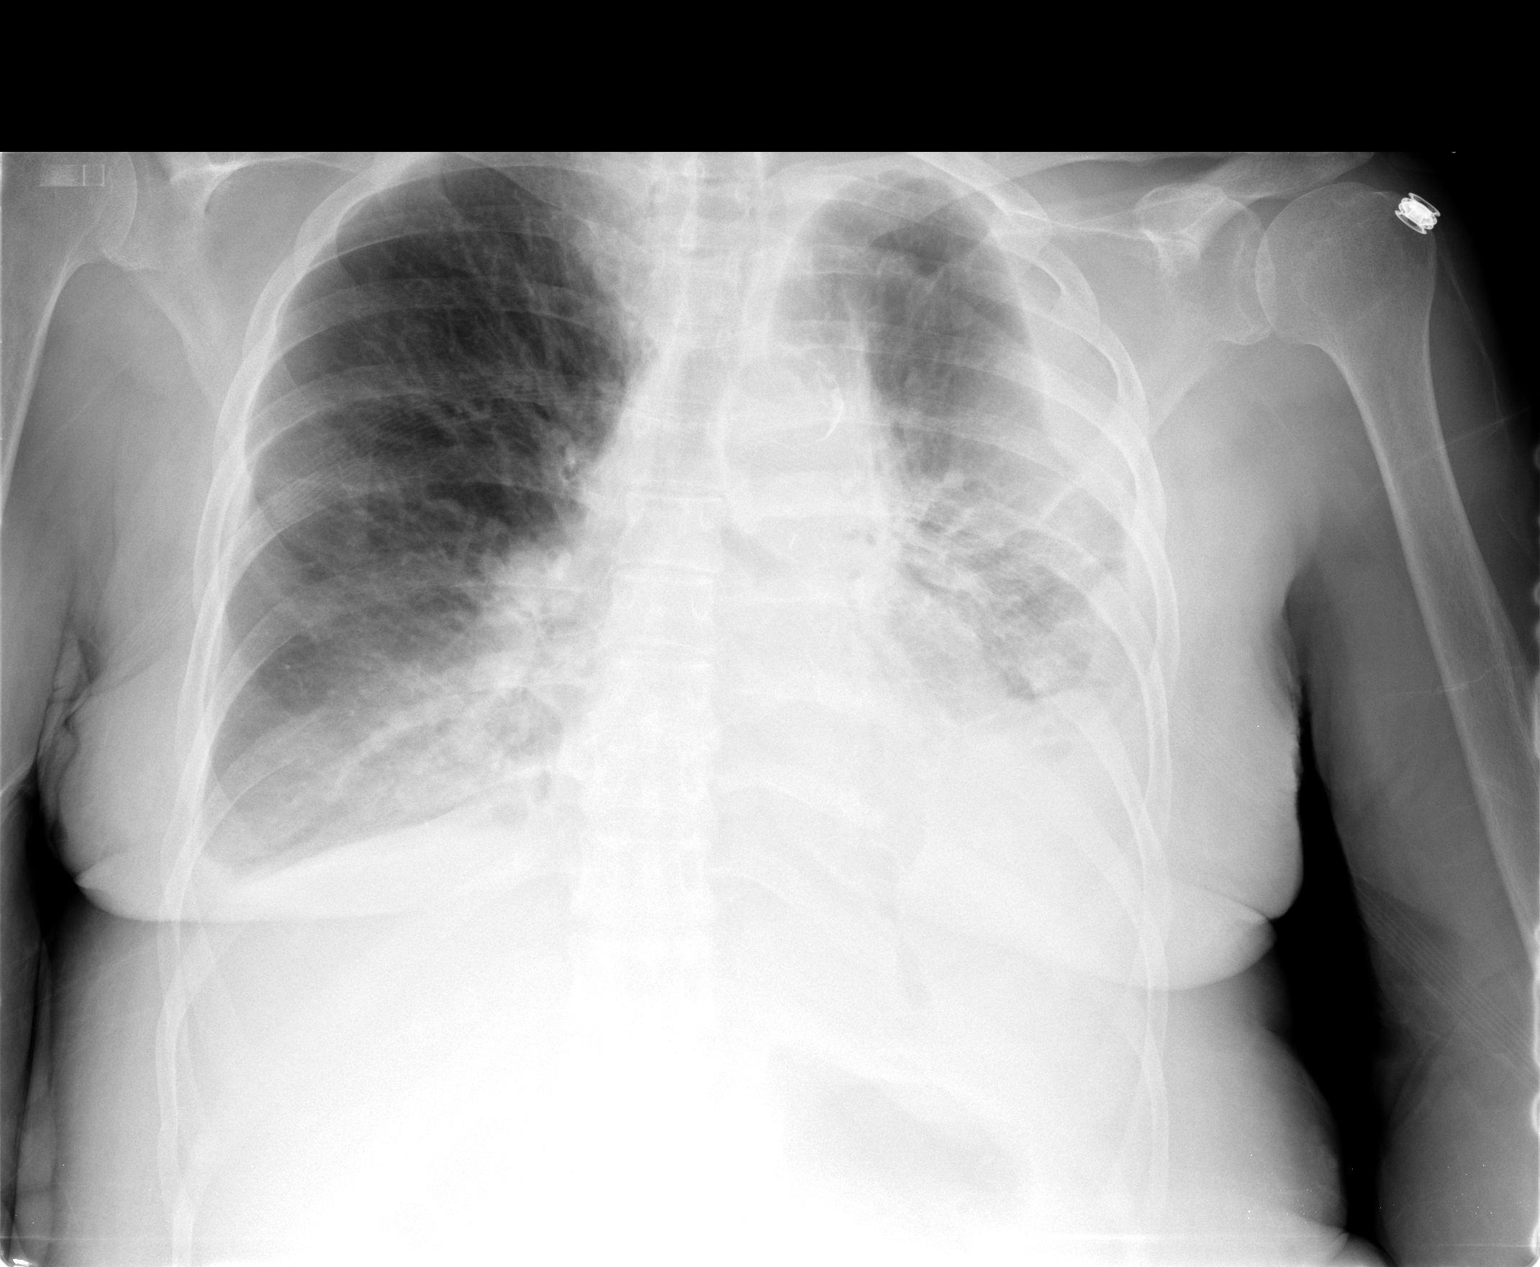

[2 of 2 positions shown; findings below may reference images not displayed]

FINDINGS: Cardiac and mediastinal silhouettes are stable as compared to prior
study. Atherosclerotic calcifications present within the aortic
arch.

Bilateral pleural effusions are present, left greater than right,
and increased in size relative to prior study from 03/18/2014. There
is pleural capping of the left pleural effusion. There is increased
bibasilar patchy opacities, which may reflect increasing atelectasis
or infiltrates. There is increased perihilar vascular congestion
without overt pulmonary edema. No pneumothorax.

No acute osseus abnormality.
IMPRESSION: 1. Bilateral pleural effusions, left greater than right, increased
in size relative to 03/18/2014.
2. Increased patchy bibasilar opacities, which may reflect worsened
atelectasis, infiltrates, and/or vascular congestion.
3. Increased perihilar vascular congestion without overt pulmonary
edema.

## 2015-08-05 IMAGING — CR DG CHEST 2V
2 series · 2 of 2 positions shown · non-contrast
Comparison: 04/28/2014.

CLINICAL DATA: Lung cancer.  Cough and shortness of Breath.

EXAM:
CHEST  2 VIEW

[w chest lat]
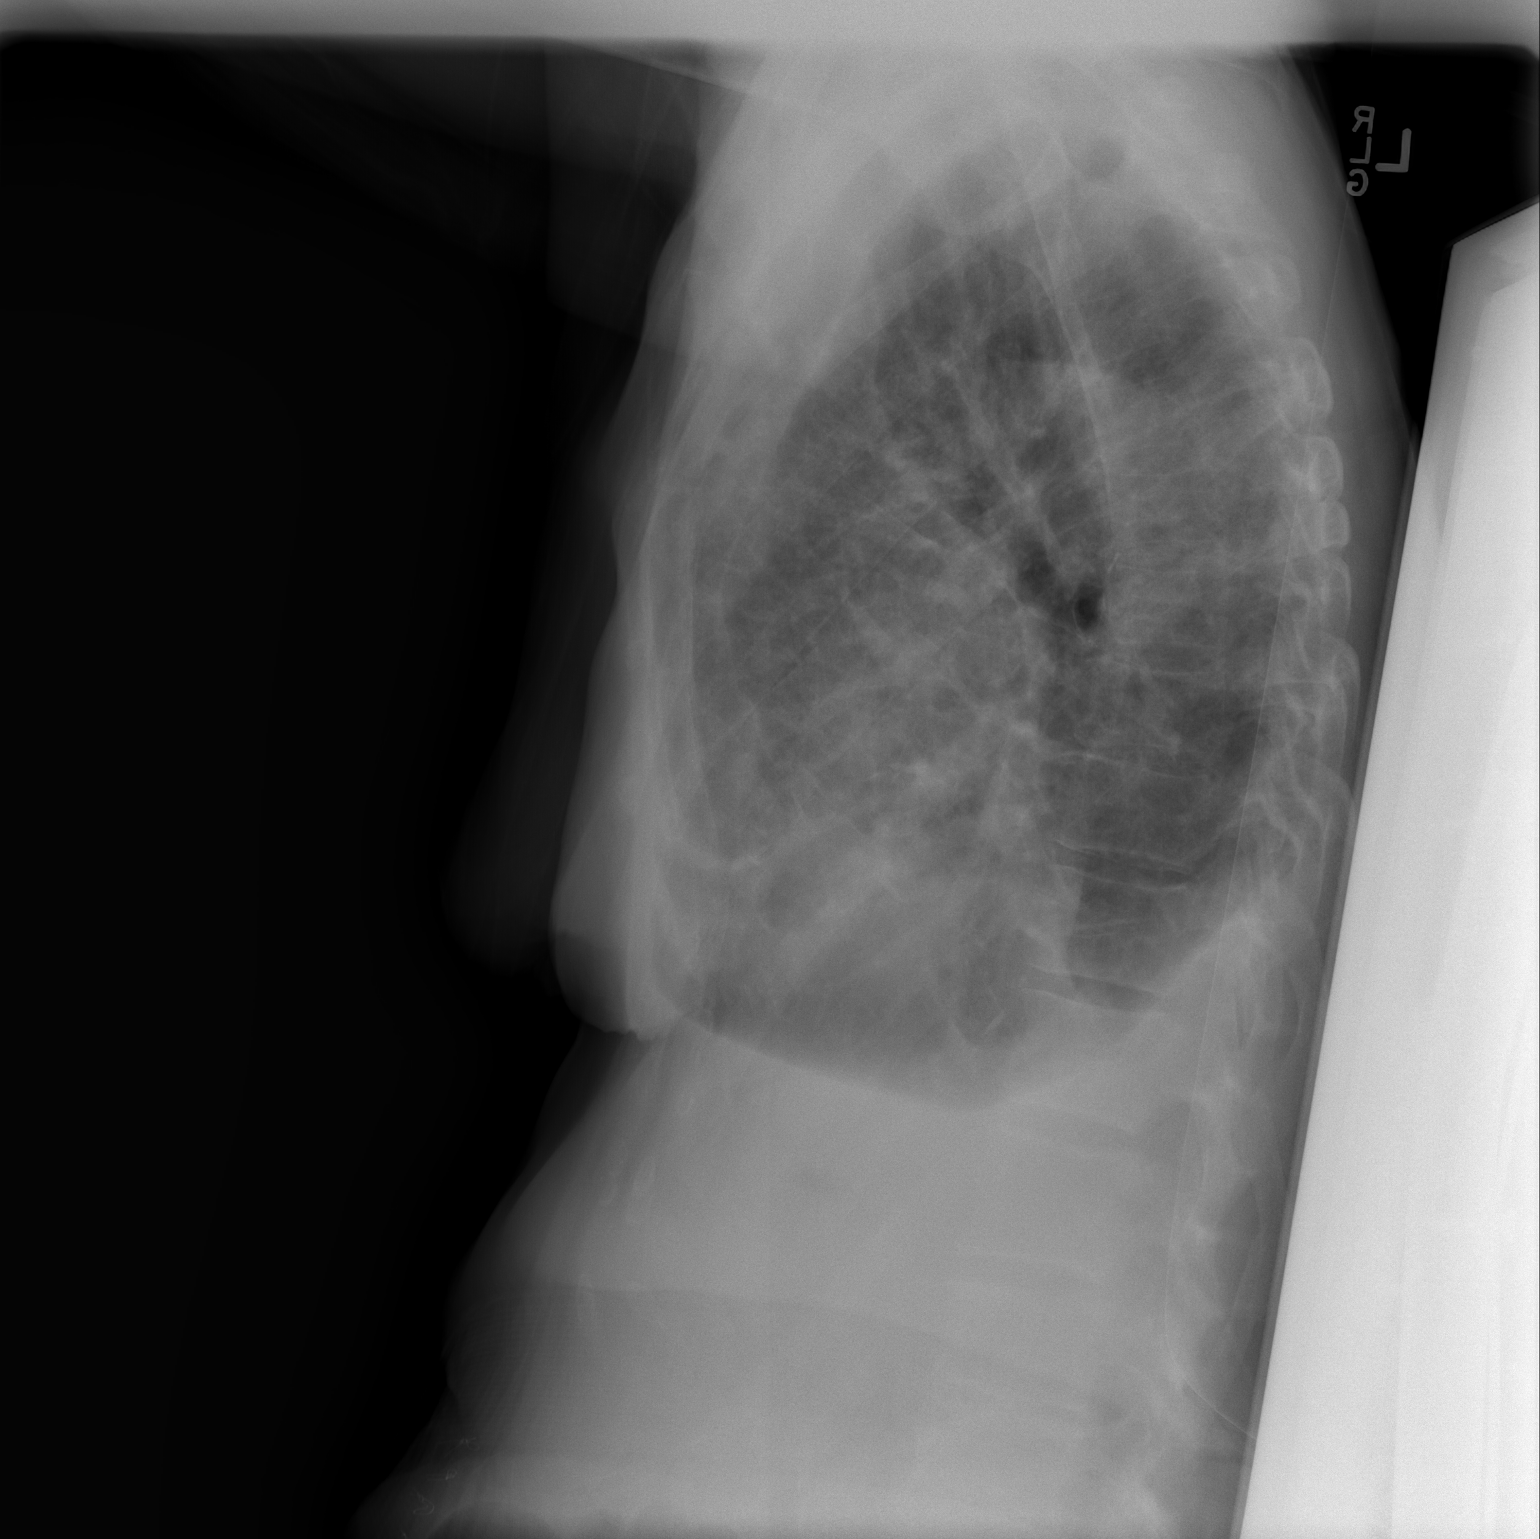

[view not recorded]
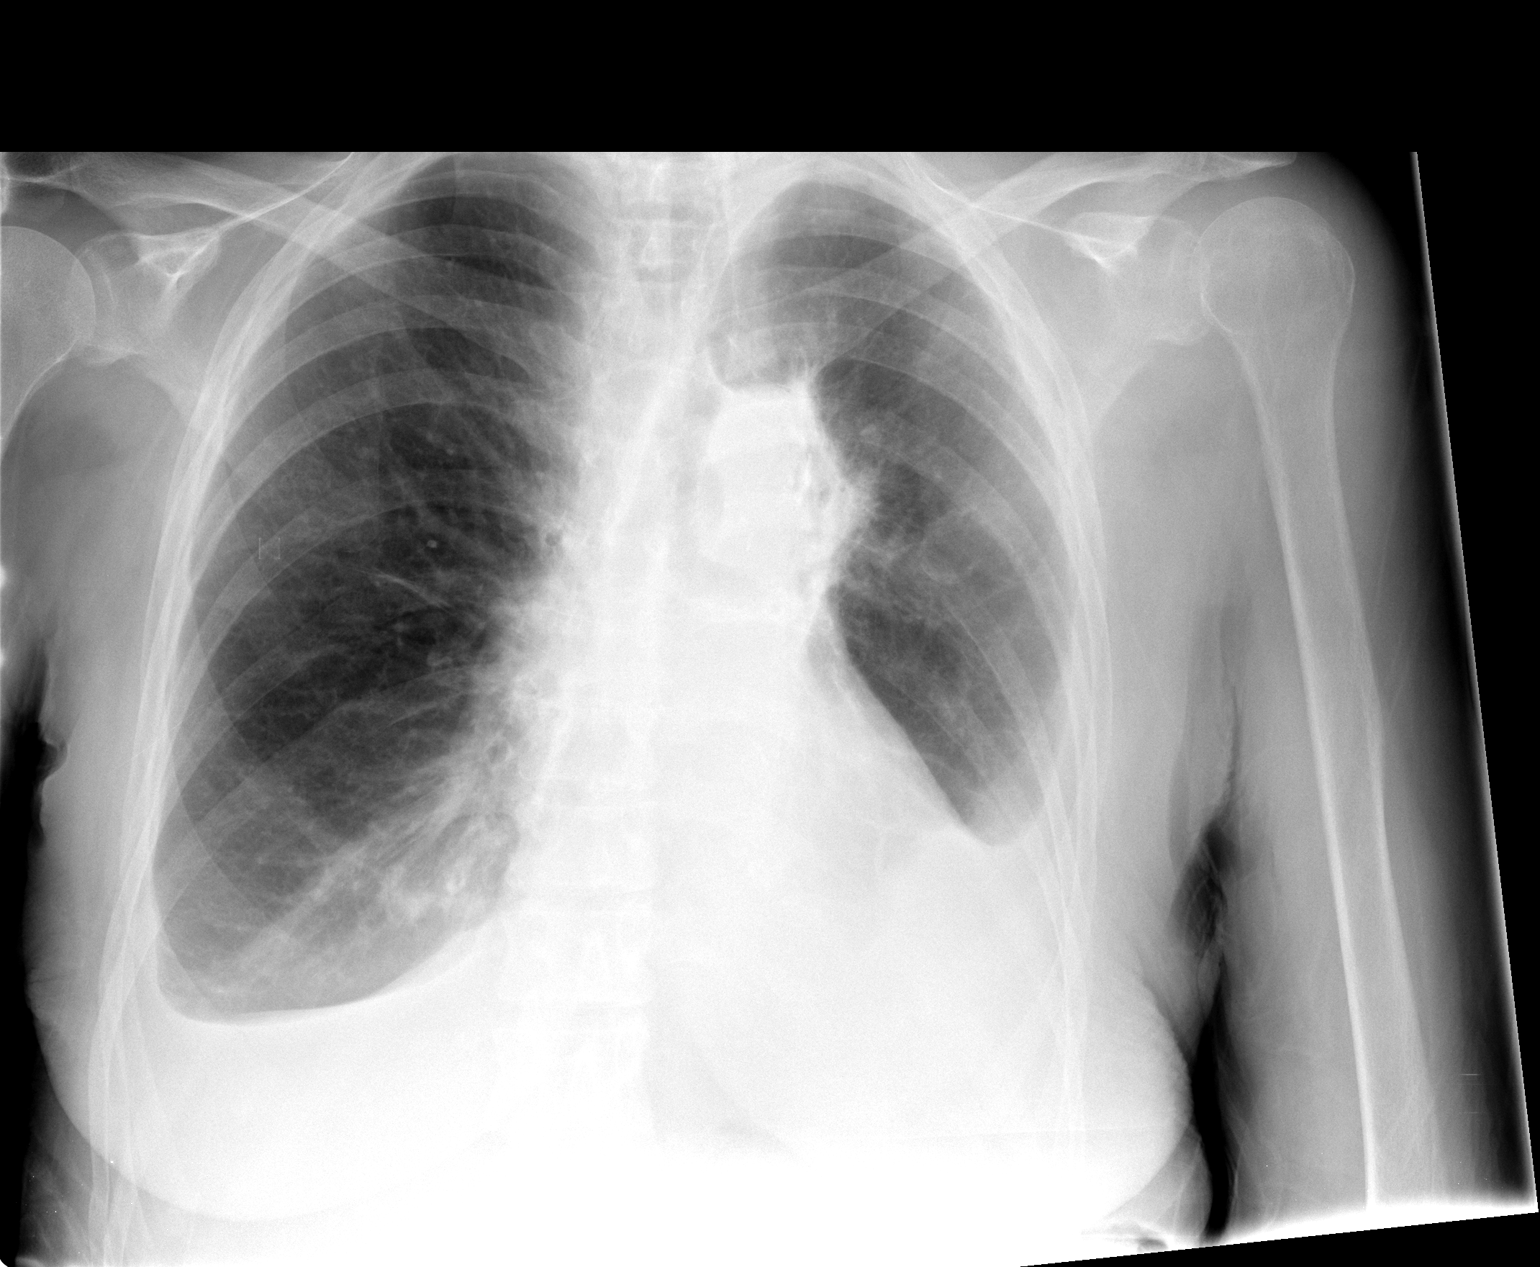

[2 of 2 positions shown; findings below may reference images not displayed]

FINDINGS: The cardiac silhouette, mediastinal and hilar contours are stable.
Stable radiation fibrosis. There is a recurrent left pleural
effusion and a small stable right pleural effusion. No worrisome
lung lesions or obvious pulmonary nodules. The bony thorax is
intact.
IMPRESSION: Stable radiation changes.

Recurrent small to moderate-sized left pleural effusion. Stable
small right pleural effusion.

No obvious pulmonary nodules.

## 2015-09-01 ENCOUNTER — Telehealth: Payer: Self-pay | Admitting: *Deleted

## 2015-09-01 NOTE — Telephone Encounter (Signed)
Call received in [place] from [facility] with [place] stating pt passed this am.

## 2015-09-14 DEATH — deceased
